# Patient Record
Sex: Male | Born: 1955 | Race: White | Hispanic: No | Marital: Married | Smoking: Never smoker
Health system: Southern US, Community
[De-identification: ages and names within clinical notes are randomized; demographics above are authoritative.]

## PROBLEM LIST (undated history)

## (undated) DIAGNOSIS — I10 Essential (primary) hypertension: Secondary | ICD-10-CM

## (undated) DIAGNOSIS — Z8619 Personal history of other infectious and parasitic diseases: Secondary | ICD-10-CM

## (undated) DIAGNOSIS — F419 Anxiety disorder, unspecified: Secondary | ICD-10-CM

## (undated) HISTORY — DX: Essential (primary) hypertension: I10

## (undated) HISTORY — DX: Personal history of other infectious and parasitic diseases: Z86.19

## (undated) HISTORY — DX: Anxiety disorder, unspecified: F41.9

## (undated) HISTORY — PX: FINGER SURGERY: SHX640

---

## 2005-05-21 ENCOUNTER — Ambulatory Visit: Payer: Self-pay | Admitting: Family Medicine

## 2005-07-05 ENCOUNTER — Ambulatory Visit: Payer: Self-pay | Admitting: Family Medicine

## 2005-07-24 ENCOUNTER — Ambulatory Visit: Payer: Self-pay | Admitting: Family Medicine

## 2005-09-11 ENCOUNTER — Ambulatory Visit: Payer: Self-pay | Admitting: Family Medicine

## 2005-12-05 ENCOUNTER — Ambulatory Visit: Payer: Self-pay | Admitting: Family Medicine

## 2006-04-22 ENCOUNTER — Ambulatory Visit: Payer: Self-pay | Admitting: Family Medicine

## 2006-09-03 ENCOUNTER — Ambulatory Visit: Payer: Self-pay | Admitting: Family Medicine

## 2006-09-06 ENCOUNTER — Ambulatory Visit (HOSPITAL_COMMUNITY): Admission: RE | Admit: 2006-09-06 | Discharge: 2006-09-06 | Payer: Self-pay | Admitting: Family Medicine

## 2007-06-26 ENCOUNTER — Ambulatory Visit: Payer: Self-pay | Admitting: Internal Medicine

## 2007-07-08 ENCOUNTER — Ambulatory Visit: Payer: Self-pay | Admitting: Internal Medicine

## 2008-03-01 ENCOUNTER — Ambulatory Visit: Payer: Self-pay | Admitting: Internal Medicine

## 2008-03-01 DIAGNOSIS — K222 Esophageal obstruction: Secondary | ICD-10-CM

## 2008-03-01 DIAGNOSIS — K449 Diaphragmatic hernia without obstruction or gangrene: Secondary | ICD-10-CM | POA: Insufficient documentation

## 2008-03-01 DIAGNOSIS — K219 Gastro-esophageal reflux disease without esophagitis: Secondary | ICD-10-CM | POA: Insufficient documentation

## 2008-07-13 ENCOUNTER — Encounter: Payer: Self-pay | Admitting: Pulmonary Disease

## 2008-07-28 ENCOUNTER — Encounter: Payer: Self-pay | Admitting: Pulmonary Disease

## 2008-07-28 ENCOUNTER — Ambulatory Visit (HOSPITAL_COMMUNITY): Admission: RE | Admit: 2008-07-28 | Discharge: 2008-07-28 | Payer: Self-pay | Admitting: Family Medicine

## 2008-08-03 ENCOUNTER — Emergency Department (HOSPITAL_COMMUNITY): Admission: EM | Admit: 2008-08-03 | Discharge: 2008-08-03 | Payer: Self-pay | Admitting: Emergency Medicine

## 2008-08-11 ENCOUNTER — Ambulatory Visit: Payer: Self-pay | Admitting: Pulmonary Disease

## 2008-08-11 DIAGNOSIS — Z77090 Contact with and (suspected) exposure to asbestos: Secondary | ICD-10-CM | POA: Insufficient documentation

## 2008-08-11 DIAGNOSIS — J209 Acute bronchitis, unspecified: Secondary | ICD-10-CM | POA: Insufficient documentation

## 2008-12-15 ENCOUNTER — Ambulatory Visit: Payer: Self-pay | Admitting: Internal Medicine

## 2008-12-15 DIAGNOSIS — K31819 Angiodysplasia of stomach and duodenum without bleeding: Secondary | ICD-10-CM | POA: Insufficient documentation

## 2008-12-22 ENCOUNTER — Telehealth: Payer: Self-pay | Admitting: Internal Medicine

## 2009-01-24 ENCOUNTER — Encounter (HOSPITAL_COMMUNITY): Admission: RE | Admit: 2009-01-24 | Discharge: 2009-02-23 | Payer: Self-pay | Admitting: Internal Medicine

## 2009-07-25 ENCOUNTER — Ambulatory Visit: Payer: Self-pay | Admitting: Internal Medicine

## 2009-07-25 DIAGNOSIS — R143 Flatulence: Secondary | ICD-10-CM

## 2009-07-25 DIAGNOSIS — R141 Gas pain: Secondary | ICD-10-CM

## 2009-07-25 DIAGNOSIS — E669 Obesity, unspecified: Secondary | ICD-10-CM | POA: Insufficient documentation

## 2009-07-25 DIAGNOSIS — R142 Eructation: Secondary | ICD-10-CM

## 2009-12-03 IMAGING — CT CT CHEST W/ CM
2 of 3 series · 15 of 36 positions shown, 18 images · IV contrast (Omnipaque 300)
Comparison: None

CLINICAL DATA: Lung nodules with history of asbestos exposure.

CT CHEST WITH CONTRAST
TECHNIQUE: Multidetector CT imaging of the chest was performed
following the standard protocol during bolus administration of
intravenous contrast.
Contrast: 80 ml 2mnipaque-9AA

[Series 2: chestroutine 5.0 b40f · axial · 0.74mm/px · z∈[-340,-70]mm · 12 of 64 slices shown, 15 images]
[im 5/64  mediastinal]
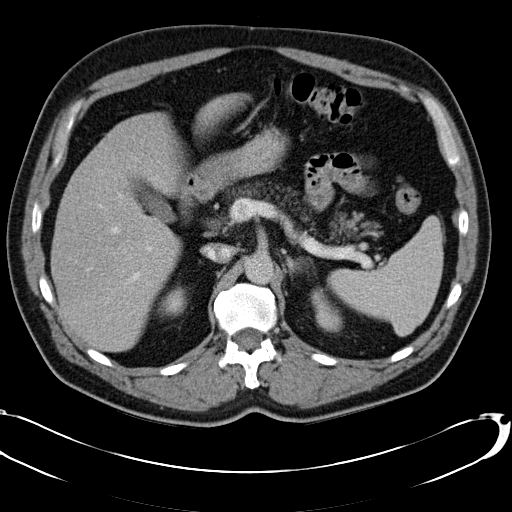
[im 5/64  lung]
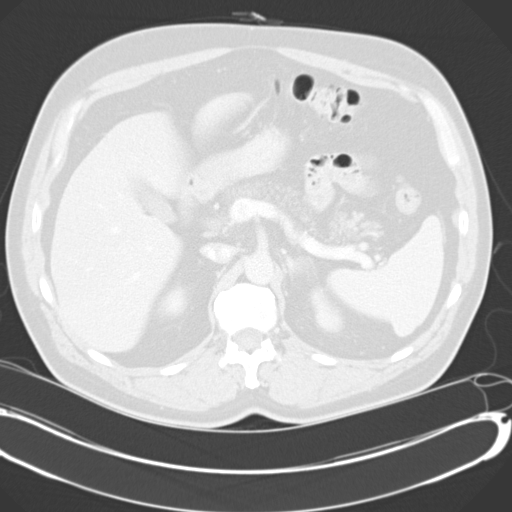
[im 10/64  lung]
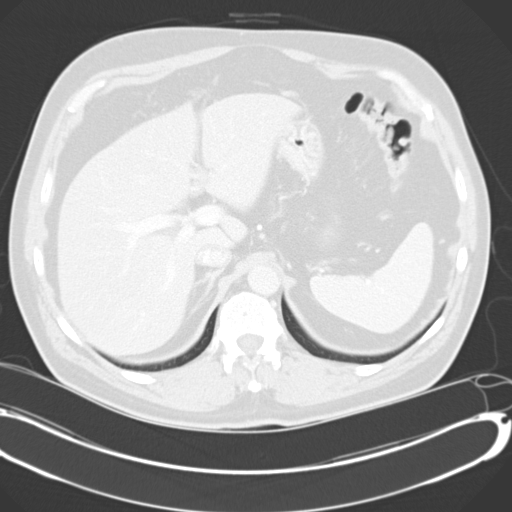
[im 15/64  lung]
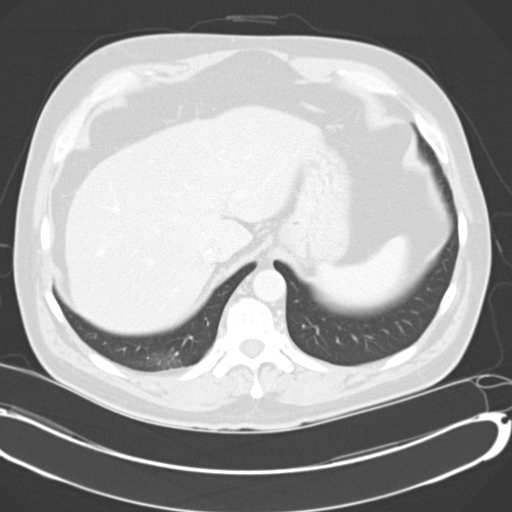
[im 19/64  lung]
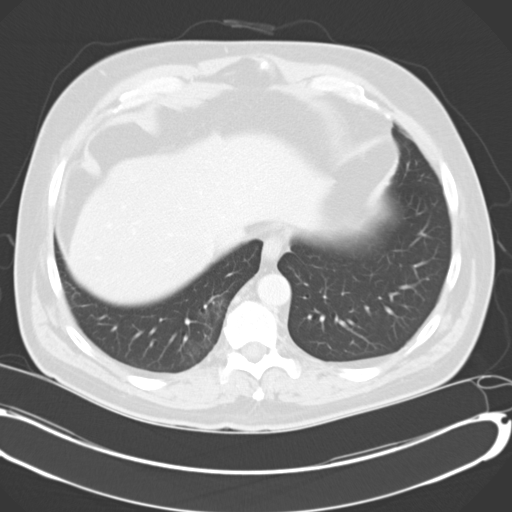
[im 24/64  mediastinal]
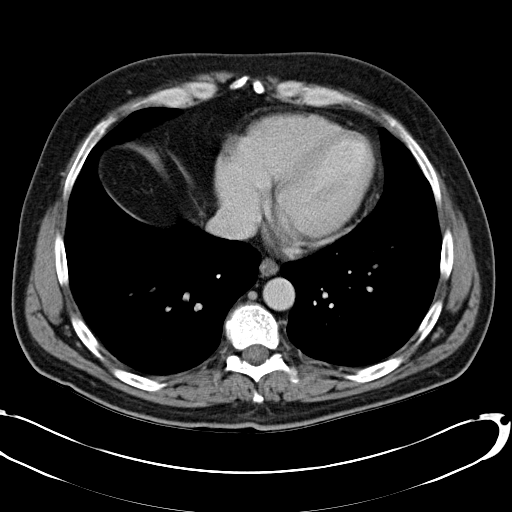
[im 24/64  lung]
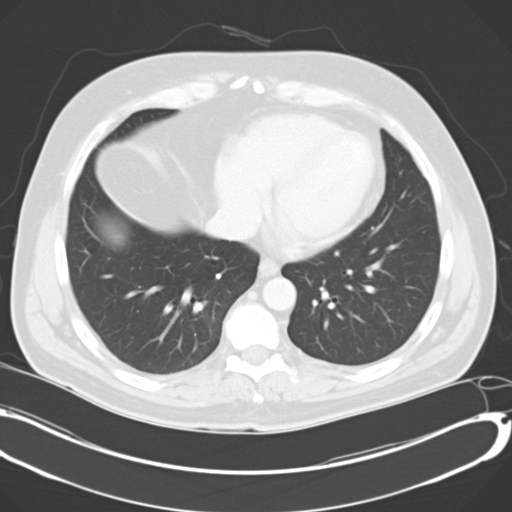
[im 29/64  lung]
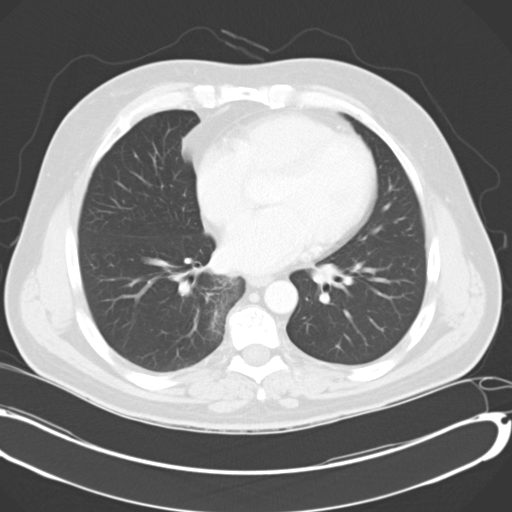
[im 36/64  lung]
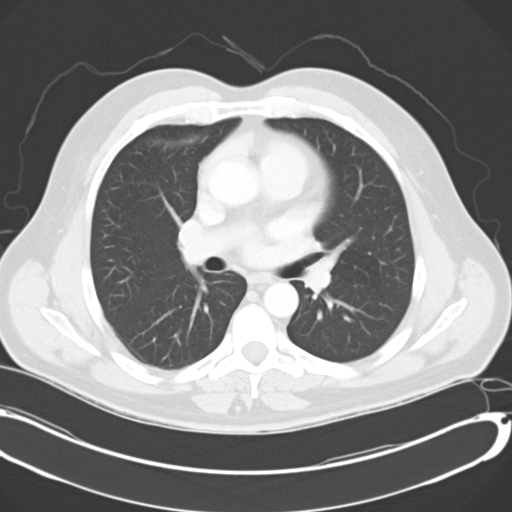
[im 40/64  lung]
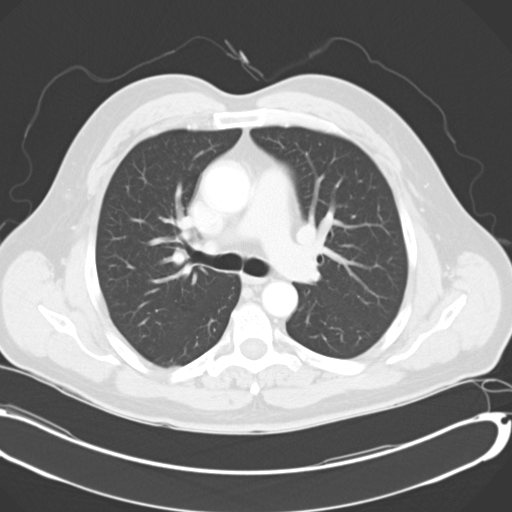
[im 45/64  mediastinal]
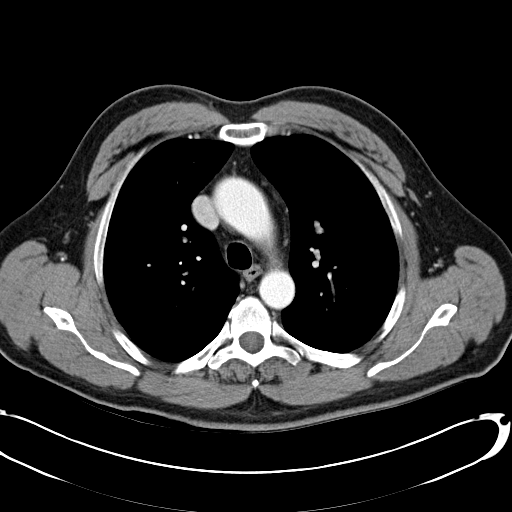
[im 45/64  lung]
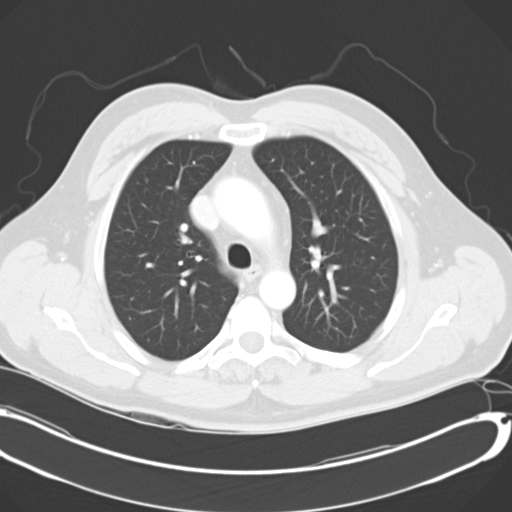
[im 50/64  lung]
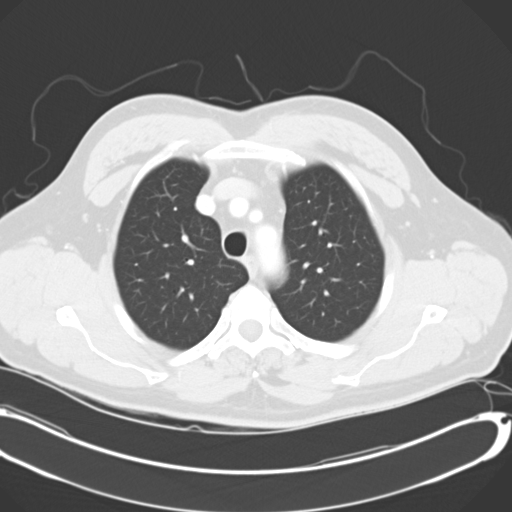
[im 54/64  lung]
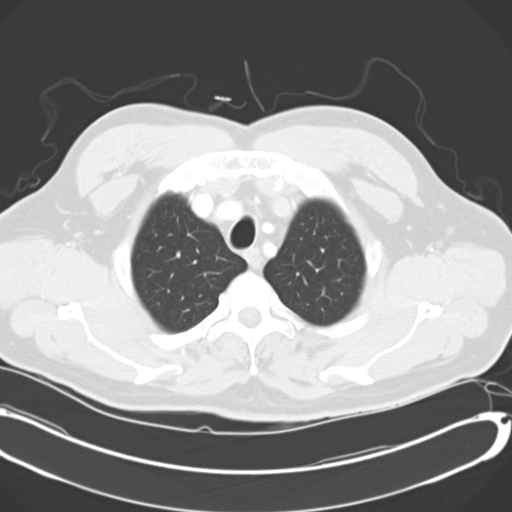
[im 59/64  lung]
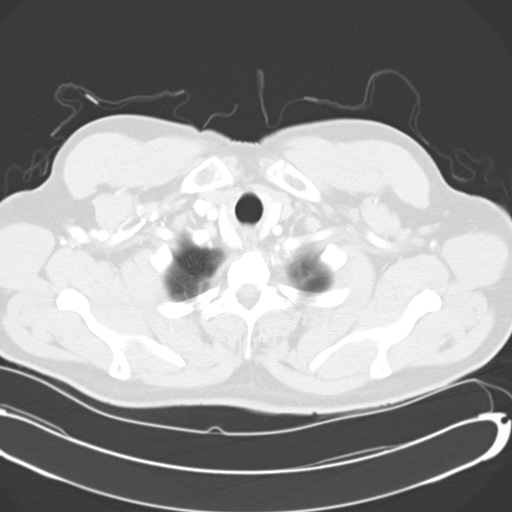

[Series 4: mpr coronal chest 3mm · coronal · 0.62mm/px · 3 of 91 slices shown]
[im 19/91  lung]
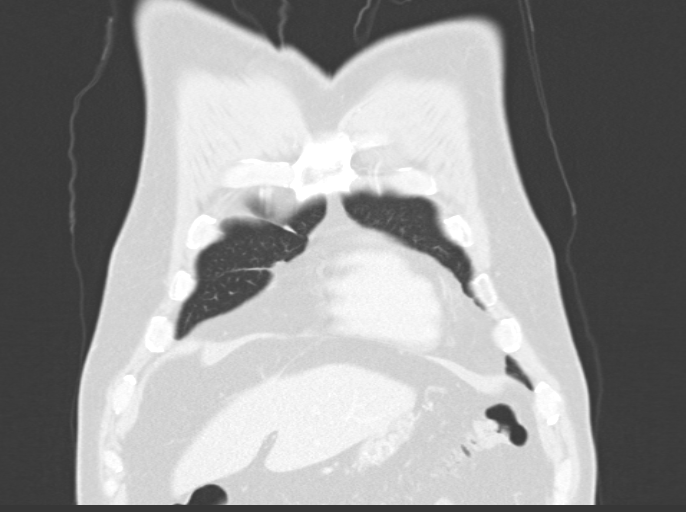
[im 37/91  lung]
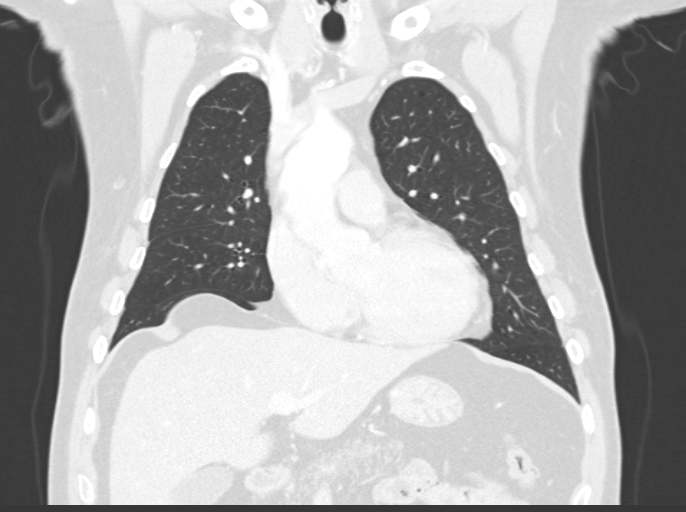
[im 55/91  lung]
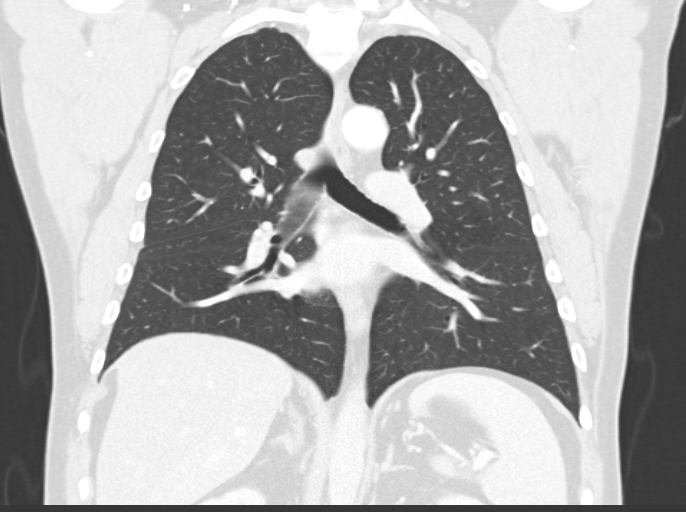

[15 of 36 positions shown; findings below may reference images not displayed]

FINDINGS: No pathologically enlarged mediastinal, hilar or axillary
lymph nodes.  Heart size within normal limits.  No pericardial
effusion.

Ill-defined ground-glass is seen in the medial aspect of the right
lower lobe.  Lungs are otherwise clear.  No pleural fluid.  Airway
is unremarkable.

Incidental imaging of the upper abdomen shows no acute findings.
Incidental note is made of a duodenal diverticulum.  No worrisome
lytic or sclerotic lesions.
IMPRESSION: 1.  Ill-defined ground-glass in the medial aspect of the right
lower lobe may be infectious or post infectious in etiology.
2.  No pulmonary nodules or evidence of asbestos related pleural
disease.

## 2009-12-09 IMAGING — CR DG CHEST 2V
2 series · 2 of 2 positions shown · non-contrast
Comparison: None

CLINICAL DATA: Weakness, congestion, elevated CK level

CHEST - 2 VIEW

[view not recorded (1 of 2)]
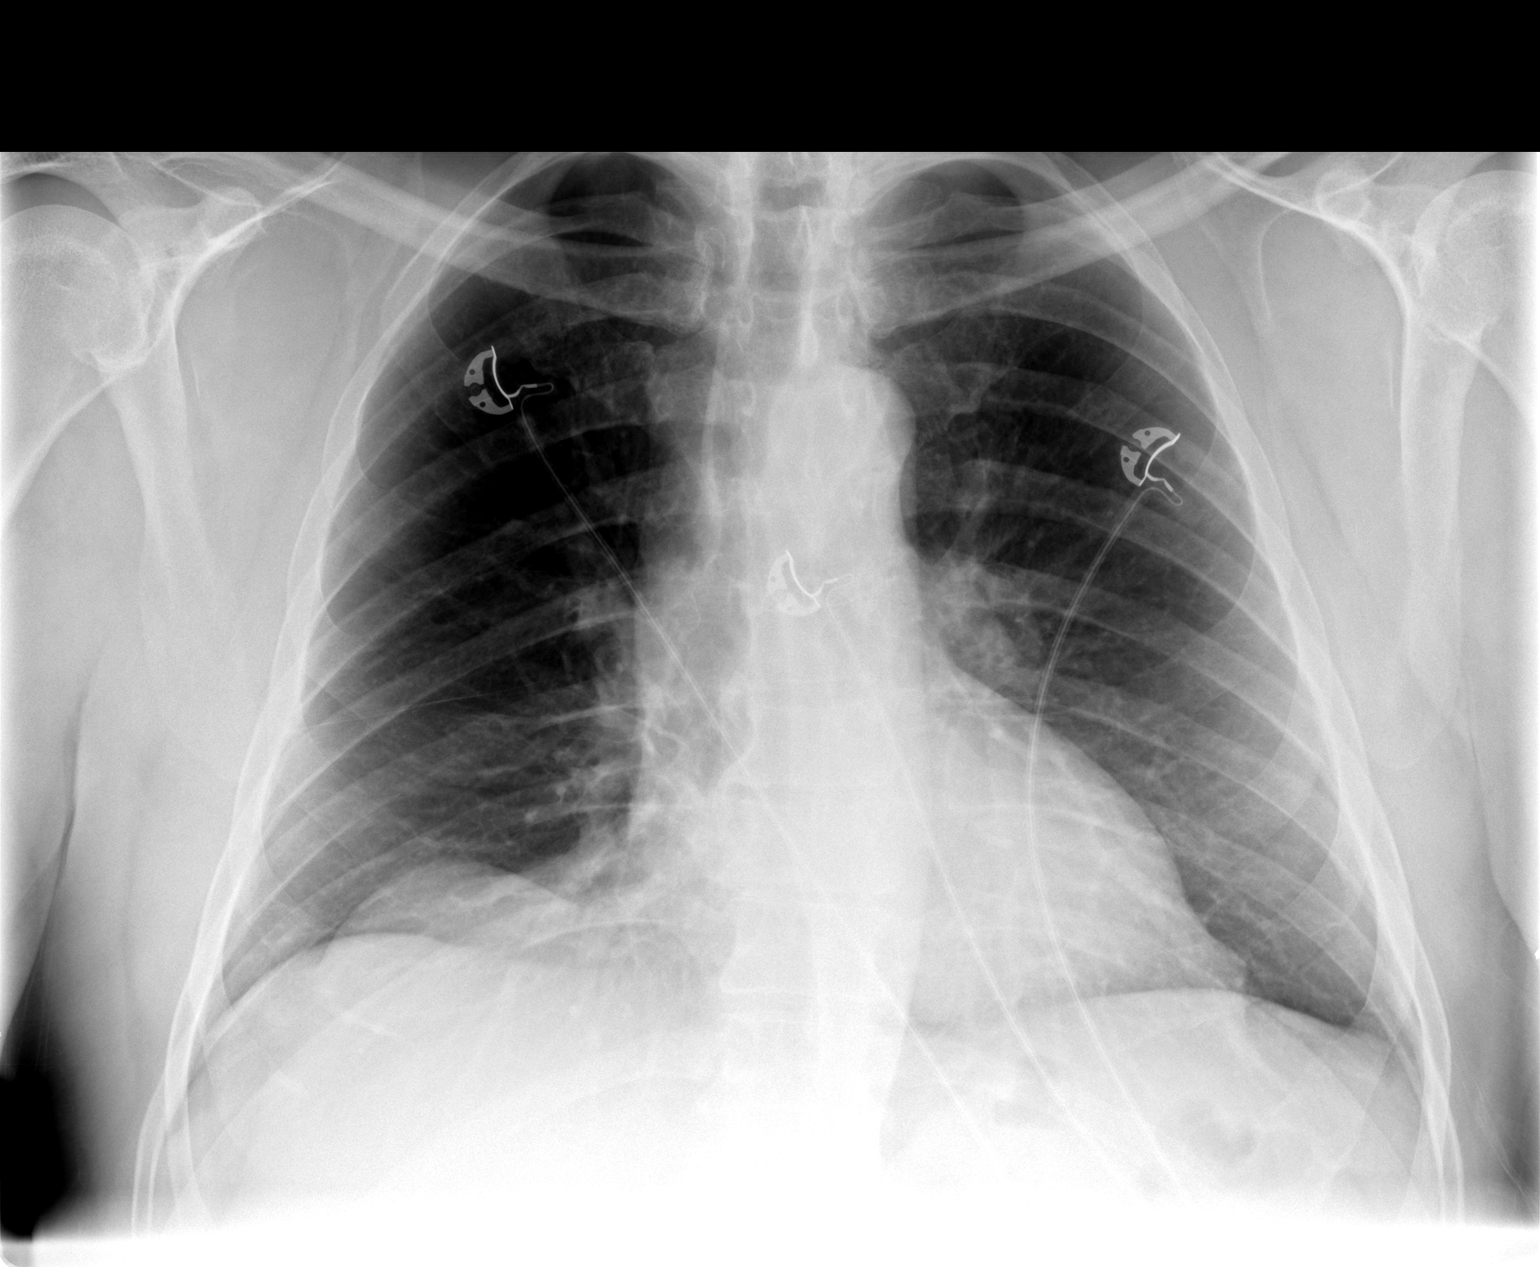

[view not recorded (2 of 2)]
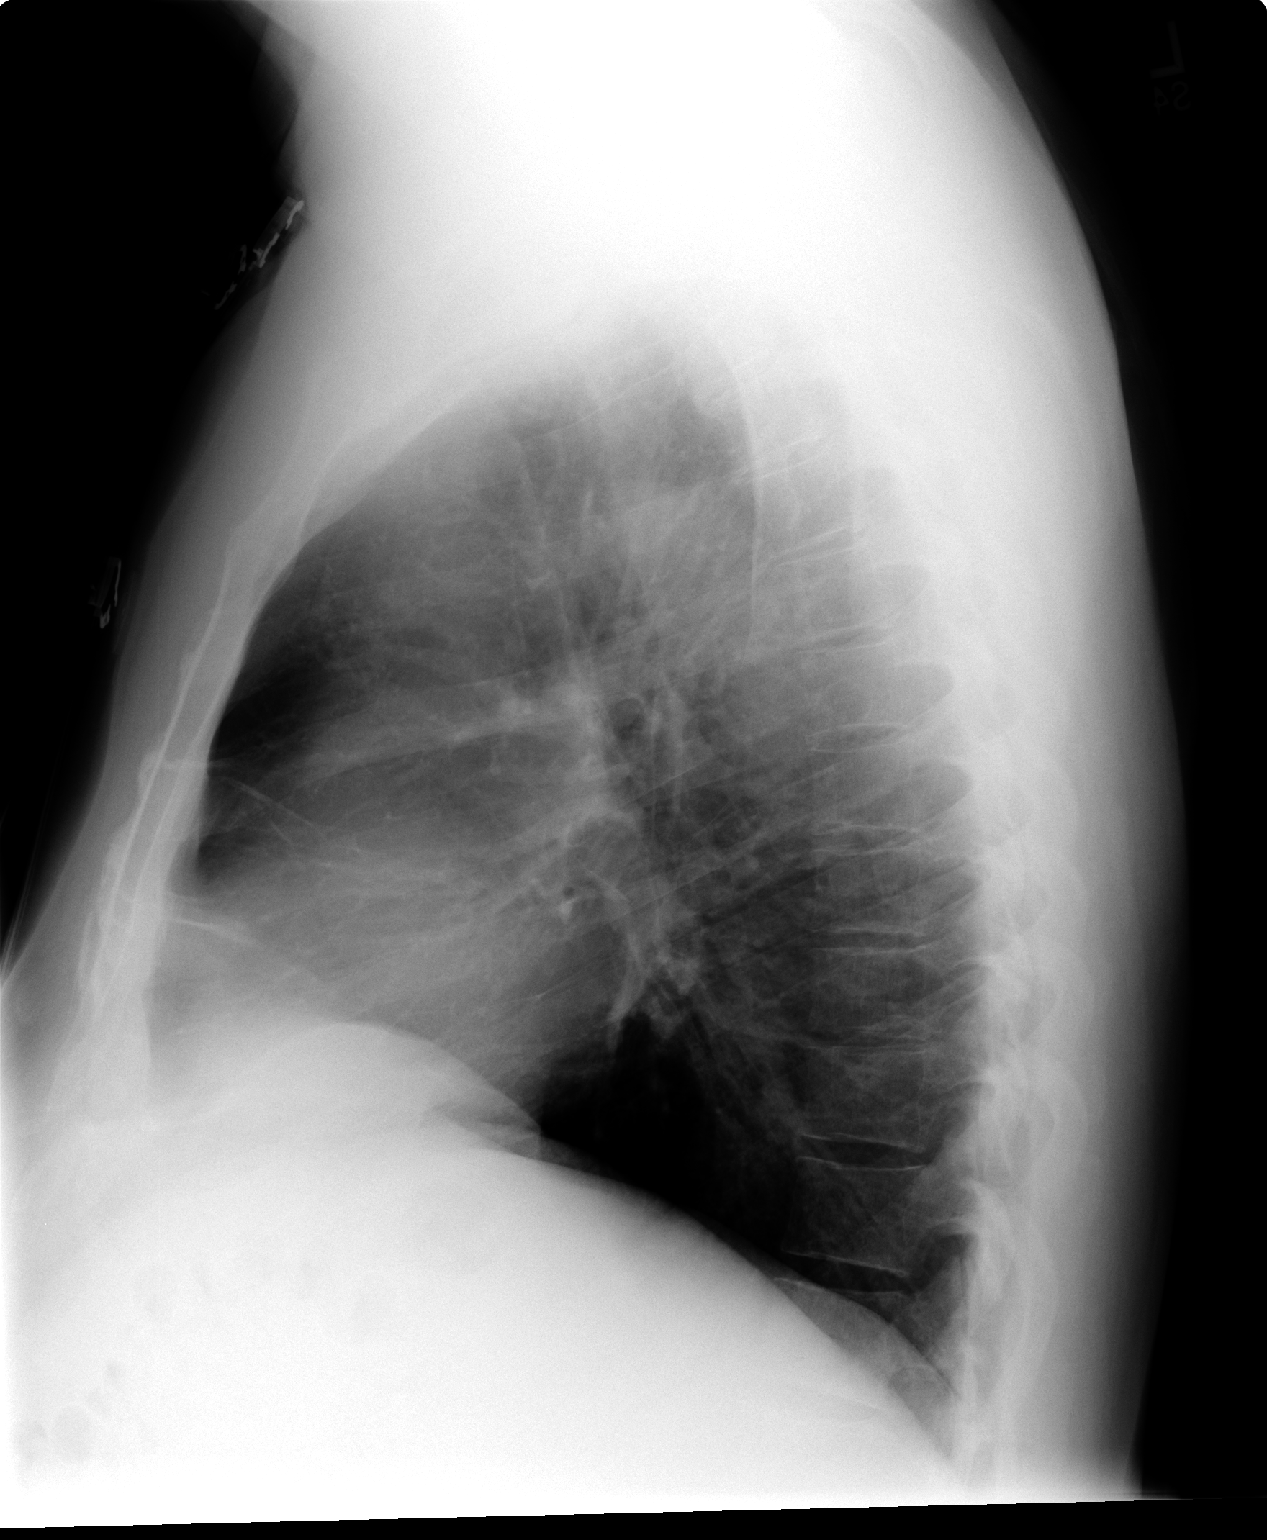

[2 of 2 positions shown; findings below may reference images not displayed]

FINDINGS: Borderline cardiac enlargement.
Normal mediastinal contours and pulmonary vascularity.
Anterior eventration right diaphragm.
Minimal subsegmental atelectasis right middle lobe.
Minimal peribronchial thickening.
No pulmonary infiltrate or pleural effusion.
Bones unremarkable.
IMPRESSION: Bronchitic changes with minimal subsegmental atelectasis at right
lung base.

## 2010-02-15 ENCOUNTER — Ambulatory Visit: Payer: Self-pay | Admitting: Internal Medicine

## 2010-02-15 LAB — CONVERTED CEMR LAB: IgA: 297 mg/dL (ref 68–378)

## 2010-02-17 ENCOUNTER — Telehealth: Payer: Self-pay | Admitting: Internal Medicine

## 2010-02-27 ENCOUNTER — Telehealth (INDEPENDENT_AMBULATORY_CARE_PROVIDER_SITE_OTHER): Payer: Self-pay | Admitting: *Deleted

## 2010-04-19 ENCOUNTER — Telehealth: Payer: Self-pay | Admitting: Internal Medicine

## 2010-05-16 NOTE — Progress Notes (Signed)
Summary: celiac labs negative  Phone Note Outgoing Call   Summary of Call: Let him know labs negative for celiac disease I want him to try Xifaxan 550 mg three times a day x 10 days for possible bacterial overgrowth looks like it is covered on his [employer] plan - if he cannot use that for rx then do metronidazole 500 mg two times a day x 10 days after he takes it he should call back after 2 weeks to let me know if it helped Iva Boop MD, [facility]  February 17, 2010 8:31 PM   Follow-up for Phone Call        LM to [facility] at home number, no message left on cell number-that is wife's cell, no message left at work number-as name on voicemail does not match pt's. Francee Piccolo CMA [facility] Dull)  February 21, 2010 10:45 AM   Notified pt of results.  He is agreeable with plan.  [facility]an was [facility] to [facility].  I will call pt in two weeks for update as pt states he will probably forget! Follow-up by: Francee Piccolo CMA [facility] Dull),  February 22, 2010 9:15 AM    New/Updated Medications: XIFAXAN 550 MG TABS (RIFAXIMIN) Take 1 tablet by mouth three times a day for 10 days Prescriptions: XIFAXAN 550 MG TABS (RIFAXIMIN) Take 1 tablet by mouth three times a day for 10 days  #30 x 0   Entered by:   Francee Piccolo CMA (AAMA)   Authorized by:   Iva Boop MD, [facility]   Signed by:   Francee Piccolo CMA (AAMA) on 02/22/2010   Method used:   [facility] to        [facility] [phone]* (retail)       [address]       [place], [place]  [address]       Ph: [phone] or [phone]       Fax: [phone]   RxID:   [id number]

## 2010-05-16 NOTE — Assessment & Plan Note (Signed)
Summary: stomach pain...em    History of Present Illness Visit Type: Follow-up Visit Primary GI MD: Stan Head MD [facility] Primary Provider: Joette Catching MD Requesting Provider: n/a Chief Complaint: abdominal pain constant ache History of Present Illness:   Last seen Sept 2010 and says his pain has persisted most of the time. He still has a band-like aggravating upper abdominal discomfort. Testosterone just started, HCTZ also startd since here last. Complains of as and bloating at times. Only gets heartbrn if he eats late at night. Sometimes sharper pain in upper abdomen relieved by belching. Sleep is not disturbed by this but only sleeps about 3 hours at night x years. Finds it hard to rest.  Knows he is obese, ordered P90X yesterday. He is concerned that he miht have cancer.   GI Review of Systems    Reports abdominal pain, belching, and  bloating.     Location of  Abdominal pain: upper abdomen.    Denies acid reflux, chest pain, dysphagia with liquids, dysphagia with solids, heartburn, loss of appetite, nausea, vomiting, vomiting blood, weight loss, and  weight gain.        Denies anal fissure, black tarry stools, change in bowel habit, constipation, diarrhea, diverticulosis, fecal incontinence, heme positive stool, hemorrhoids, irritable bowel syndrome, jaundice, light color stool, liver problems, rectal bleeding, and  rectal pain.    EGD  Procedure date:  07/08/2007  Findings:      1) LOWER ESOPHAGEAL RING WITH SOME ACTIVE ESOPHAGITIS (POSSIBLE VERY SHORT BARRETT'S ESOPHAGUS). DILATED TO 54 FR 2) 2 CM SLIDING HIATAL HERNIA 3) OTHERWISE NORMAL  Path Inflammation no metaplasia  Colonoscopy  Procedure date:  07/08/2007  Findings:      Results: Normal.   Comments:      Repeat colonoscopy in 10 years.    Procedures Next Due Date:    Colonoscopy: 07/2017   Current Medications (verified): 1)  Hydrochlorothiazide 25 Mg Tabs (Hydrochlorothiazide) .... Once Daily 2)   Zegerid 40-1100 Mg  Caps (Omeprazole-Sodium Bicarbonate) .... Take 1 Capsule By Mouth Once A Day 3)  Mediplex Plus  Tabs (Multiple Vitamins-Minerals) .... Once Daily 4)  Fish Oil  Oil (Fish Oil) .... Once Daily 5)  Testosterone Shot .... Weekly  Allergies (verified): No Known Drug Allergies  Past History:  Past Medical History: Reviewed history from 03/01/2008 and no changes required. GERD, esophageal stricture Hiatal Hernia Hypertension  Past Surgical History: Reviewed history from 03/01/2008 and no changes required. Unremarkable  Family History: Reviewed history from 03/01/2008 and no changes required. 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  Alcohol Use - no Illicit Drug Use - no Patient gets regular exercise.  Review of Systems       The patient complains of allergy/sinus and sleeping problems.    Vital Signs:  Patient profile:   55 year old male Height:      68 inches Weight:      209.38 pounds BMI:     31.95 Pulse rate:   100 / minute Pulse rhythm:   regular BP sitting:   130 / 94  (left arm) Cuff size:   regular  Vitals Entered By: June McMurray CMA Duncan Dull) (July 25, 2009 4:07 PM)  Physical Exam  General:  Well developed, well nourished, no acute distress. Overweight to obese Eyes:  anicteric Lungs:  Clear throughout to auscultation. Heart:  Regular rate and rhythm; no murmurs, rubs,  or bruits. Abdomen:  soft and nontender, no masses   Impression & Recommendations:  Problem # 1:  ABDOMINAL PAIN, UPPER (ICD-789.09) Assessment Unchanged since 2007 with bloating, etc [place], HIDA, EGD and CT Chest all neagtive 2008-2010  Problem # 2:  ABDOMINAL BLOATING (ICD-787.3) Assessment: Unchanged similar to before ? if some small bowel bacterial obergrowth issues will try Align  probiotic if that is not helpful he should consider a trial of buspirone which can jelp bloating/early satiety symptoms he also has underlying anxiety  Problem # 3:  OBESITY (ICD-278.00) Assessment: New He is aware of need to lose and is to try with exercise  Patient Instructions: 1)  Take 1 Align capsule daily for 1 month. Call Dr.Gessner after you complete that and let him know how you feel. 2)  You need to lose weight. Start by limiting portions, amounts. Avoid eating when not hungry. Limit desserts.Look for high fructose corn syrup on food labels and if in first 3 ingredients, avoid that food. Also try to eat whole grains, avoid "white foods" (e.g. white rice, white bread).  Also avoid high-fructose corn syrup, it is in many foods. 3)  Copy sent to : Joette Catching, MD 4)  The medication list was reviewed and reconciled.  All changed / newly prescribed medications were explained.  A complete medication list was provided to the patient / caregiver.

## 2010-05-16 NOTE — Progress Notes (Signed)
  Phone Note Other Incoming   Request: Send information Summary of Call: Request received from [facility] sent to [facility].

## 2010-05-16 NOTE — Assessment & Plan Note (Signed)
Summary: STOMACH DISCOMFORT...AS.    History of Present Illness Visit Type: Follow-up Visit Primary GI MD: Stan Head MD [facility] Primary Provider: Fredia Beets, MD  Requesting Provider: n/a Chief Complaint: abd discomfort  History of Present Illness:   Last seen in April 2011. He took align for 1 month with some help. Still has bilateral upper abdominal pain most likely associated with food. His brother-in-law was diagnosed with celiac disease and he wonders if that is the case. When he had called to make the appointment he was hurting fairly bad, he had been off Zegerid for 1-2 months, he went back on with help but not complete relief. He stopped testosterone for a while and symptoms did not change. Some stress associated with symptoms.    GI Review of Systems      Denies abdominal pain, acid reflux, belching, bloating, chest pain, dysphagia with liquids, dysphagia with solids, heartburn, loss of appetite, nausea, vomiting, vomiting blood, weight loss, and  weight gain.        Denies anal fissure, black tarry stools, change in bowel habit, constipation, diarrhea, diverticulosis, fecal incontinence, heme positive stool, hemorrhoids, irritable bowel syndrome, jaundice, light color stool, liver problems, rectal bleeding, and  rectal pain.    Current Medications (verified): 1)  Zegerid 40-1100 Mg  Caps (Omeprazole-Sodium Bicarbonate) .... Take 1 Capsule By Mouth Once A Day 2)  Fish Oil  Oil (Fish Oil) .... Once Daily 3)  Lisinopril 10 Mg Tabs (Lisinopril) .... One Tablet By Mouth Once Daily  Allergies (verified): No Known Drug Allergies  Past History:  Past Medical History: GERD, esophageal stricture Hiatal Hernia Hypertension ESOPHAGITIS  Past Surgical History: Reviewed history from 03/01/2008 and no changes required. Unremarkable  Family History: Reviewed history from 03/01/2008 and no changes required. No FH of Colon Cancer: Lung Cancer Mother Family History of Kidney  Disease:sister Family History of Diabetes: mother Family History of Heart mother  Social History: Reviewed history from 07/25/2009 and no changes required. 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  Alcohol Use - no Illicit Drug Use - no Patient gets regular exercise.  Vital Signs:  Patient profile:   55 year old male Height:      68 inches Weight:      212 pounds BMI:     32.35 BSA:     2.10 Pulse rate:   94 / minute Pulse rhythm:   regular BP sitting:   136 / 80  (left arm) Cuff size:   regular  Vitals Entered By: Ok Anis CMA (February 15, 2010 4:18 PM)  Physical Exam  General:  Well developed, well nourished, no acute distress. Overweight to obese Abdomen:  soft and nontender, no masses   Impression & Recommendations:  Problem # 1:  ABDOMINAL BLOATING (ICD-787.3) Assessment Unchanged  check celiac if not helpful Abx (Align helped some) consider gluten free or low Carb  Orders: T-Tissue Transglutamase Ab IgA (87564-33295) TLB-IgA (Immunoglobulin A) (82784-IGA)  Problem # 2:  OBESITY (ICD-278.00) Assessment: Unchanged discussed exercise and weight loss again has fatigue after work and some arthralgias, etc await studies he should try to exrcise and could coordinate with Dr. Lendon Colonel.  Problem # 3:  ABDOMINAL PAIN, UPPER (ICD-789.09) Assessment: Unchanged slightly better with align await TTG Ab  Patient Instructions: 1)  Please go to the basement to have your lab tests drawn today. 2)  TTG antibody and IgA level to look for celiac disease 3)  Dr. Leone Payor will call with results and plans. 4)  try to come with an exercise plan, it will help your mind and body. Anxiey may be playing some role in your problems (not all). 5)  Copy sent to : Fredia Beets, MD 6)  The medication list was reviewed and reconciled.  All changed / newly prescribed medications were explained.  A complete medication list was provided to the patient / caregiver.

## 2010-05-18 NOTE — Progress Notes (Signed)
Summary: Pt changed Primary Care Physician   Phone Note Other Incoming   Caller: pt Summary of Call: Calling to change his Primary Care Physician in our records. Is switching to Dr Fredia Beets in [place], [place]. Noted in IDX. Initial call taken by: Leanor Kail [facility],  April 19, 2010 4:22 PM

## 2010-07-26 LAB — BASIC METABOLIC PANEL
BUN: 10 mg/dL (ref 6–23)
CO2: 25 mEq/L (ref 19–32)
Chloride: 107 mEq/L (ref 96–112)
Glucose, Bld: 119 mg/dL — ABNORMAL HIGH (ref 70–99)
Potassium: 4 mEq/L (ref 3.5–5.1)
Sodium: 138 mEq/L (ref 135–145)

## 2010-07-26 LAB — CBC
MCV: 84.1 fL (ref 78.0–100.0)
RBC: 4.99 MIL/uL (ref 4.22–5.81)

## 2010-07-26 LAB — DIFFERENTIAL
Basophils Absolute: 0 10*3/uL (ref 0.0–0.1)
Basophils Relative: 0 % (ref 0–1)
Eosinophils Absolute: 0.1 10*3/uL (ref 0.0–0.7)
Lymphocytes Relative: 13 % (ref 12–46)
Monocytes Relative: 4 % (ref 3–12)

## 2010-07-26 LAB — CREATININE, SERUM
Creatinine, Ser: 0.91 mg/dL (ref 0.4–1.5)
GFR calc non Af Amer: >60 mL/min (ref >60)

## 2010-07-26 LAB — POCT CARDIAC MARKERS
CKMB, poc: <1 ng/mL — ABNORMAL LOW (ref 1.0–8.0)
Myoglobin, poc: 96.6 ng/mL (ref 12–200)

## 2010-07-26 LAB — CK TOTAL AND CKMB (NOT AT ARMC)
Relative Index: INVALID (ref 0.0–2.5)
Total CK: 90 U/L (ref 7–232)

## 2010-08-29 NOTE — Assessment & Plan Note (Signed)
[facility]                         GASTROENTEROLOGY OFFICE NOTE   David Dalton, David Dalton                        MRN:          [id number]  DATE:06/26/2007                            DOB:          Jul 09, 1955    REQUESTING PHYSICIAN:  Delaney Meigs, M.D.   REASON FOR CONSULTATION:  1. Abdominal pain.  2. Dysphagia.   ASSESSMENT:  A 55 year old white man who has had a solid food dysphagia  off and on over 1 to 2 years, usually when he ate bread quickly.  However, a month ago he had a significant food impaction relieved by  regurgitation.  A previous upper gastrointestinal series shows a small  sliding hiatus hernia and distal esophageal ring, which I think is the  cause of his dysphagia.   RECOMMENDATIONS AND PLAN:  1. Schedule upper GI endoscopy with esophageal dilation.  We will try      to treat his dysphagia.  He also has some bilateral upper abdominal      pain and epigastric pain at times, and we will look for possible      causes of that.  He is on proton pump inhibitor already.  2. Schedule screening colonoscopy in this man who is 55 and has not      yet had one.   Risks, benefits and indications of the procedures are explained.  He  understands and agrees to proceed.   HISTORY:  A 55 year old white man with problems as outline above.  He  used to have some heartburn but not much.  A year ago he started having  some upper abdominal pain, left and right, described as an irritation  and usually somewhat better when he eats.  He had the upper GI series as  well as an ultrasound.  He had the hiatal hernia and esophageal ring.  He had an incidental duodenal diverticulum.  He had small gallbladder  wall polyps, otherwise unremarkable ultrasound.  These studies were on  Sep 06, 2006.  Since that time, things got somewhat worse.  A couple of  months ago he ended up on a proton pump inhibitor taking Prevacid.  This  was switched to pantoprazole  due to insurance reasons.  He does feel  somewhat better on that but not completely relieved with this  irritation in the left, right upper quadrants and epigastrium.  Dysphagia problems as outlined above.  His GI review of systems is  otherwise completely unremarkable.   MEDICATIONS:  1. Lisinopril/hydrochlorothiazide 12.5 mg daily.  2. Pantoprazole 40 mg daily.   No known drug allergies.   P.R.N. MEDICATIONS:  Lorazepam 0.5 mg.   PAST MEDICAL HISTORY:  1. Hypertension.  2. Dyslipidemia.  3. Anxiety with some associated chest pain.   FAMILY HISTORY:  Diverticular disease in the mother and sisters.  Father  had heart disease.  Mother had diabetes.  No colon cancer.   SOCIAL HISTORY:  He is married.  He is a Data processing manager at [employer].  Lives with his wife and daughter.  No alcohol, tobacco, or  drugs.  No caffeine use.  REVIEW OF SYSTEMS:  Positive for fatigue.  All other systems negative.   PHYSICAL EXAMINATION:  Well-developed, well-nourished overweight white  man.  Height 5 8.  Weight 204 pounds.  Blood pressure 124/86.  Pulse 64.  EYES:  Anicteric.  ENT:  Normal mouth, oropharynx.  NECK:  Supple without mass.  CHEST:  Clear.  HEART:  S1, S2.  I hear no murmurs or gallops.  ABDOMEN:  Soft, nontender.  No organomegaly or mass.  Bowel sounds are  present.  There are no bruits.  RECTAL:  Deferred.  LYMPHATIC:  No neck, supraclavicular, inguinal adenopathy.  LOWER EXTREMITIES:  Free of edema.  There is no cyanosis or clubbing of  the digits of the hands.  SKIN:  Without an acute rash.  PSYCHIATRIC:  Appropriate affect and mood.  NEUROLOGIC:  Cranial nerves II through XII intact.  Grossly nonfocal.   I appreciate the opportunity to care for this patient.     David Boop, MD,FACG  Electronically Signed    CEG/MedQ  DD: 06/26/2007  DT: 06/26/2007  Job #: [id number]   cc:   Delaney Meigs, M.D.

## 2013-11-16 DIAGNOSIS — I10 Essential (primary) hypertension: Secondary | ICD-10-CM | POA: Insufficient documentation

## 2017-06-27 ENCOUNTER — Encounter: Payer: Self-pay | Admitting: Internal Medicine

## 2017-07-02 DIAGNOSIS — F4322 Adjustment disorder with anxiety: Secondary | ICD-10-CM | POA: Insufficient documentation

## 2018-05-13 ENCOUNTER — Encounter: Payer: Self-pay | Admitting: Physician Assistant

## 2018-05-13 ENCOUNTER — Ambulatory Visit (INDEPENDENT_AMBULATORY_CARE_PROVIDER_SITE_OTHER): Payer: PRIVATE HEALTH INSURANCE | Admitting: Physician Assistant

## 2018-05-13 ENCOUNTER — Other Ambulatory Visit: Payer: Self-pay

## 2018-05-13 VITALS — BP 130/80 | HR 85 | Temp 98.0°F | Resp 14 | Ht 69.0 in | Wt 220.0 lb

## 2018-05-13 DIAGNOSIS — I10 Essential (primary) hypertension: Secondary | ICD-10-CM | POA: Diagnosis not present

## 2018-05-13 DIAGNOSIS — F411 Generalized anxiety disorder: Secondary | ICD-10-CM

## 2018-05-13 DIAGNOSIS — M6208 Separation of muscle (nontraumatic), other site: Secondary | ICD-10-CM | POA: Diagnosis not present

## 2018-05-13 LAB — COMPREHENSIVE METABOLIC PANEL
ALK PHOS: 91 U/L (ref 39–117)
ALT: 37 U/L (ref 0–53)
AST: 27 U/L (ref 0–37)
Albumin: 4.4 g/dL (ref 3.5–5.2)
BILIRUBIN TOTAL: 0.4 mg/dL (ref 0.2–1.2)
BUN: 18 mg/dL (ref 6–23)
CO2: 29 meq/L (ref 19–32)
CREATININE: 0.98 mg/dL (ref 0.40–1.50)
Calcium: 10.2 mg/dL (ref 8.4–10.5)
Chloride: 102 mEq/L (ref 96–112)
GFR: 76.99 mL/min (ref >60.00)
GLUCOSE: 126 mg/dL — AB (ref 70–99)
Potassium: 5 mEq/L (ref 3.5–5.1)
SODIUM: 139 meq/L (ref 135–145)
TOTAL PROTEIN: 7 g/dL (ref 6.0–8.3)

## 2018-05-13 LAB — VITAMIN B12: VITAMIN B 12: 1094 pg/mL — AB (ref 211–911)

## 2018-05-13 LAB — LIPID PANEL
Cholesterol: 273 mg/dL — ABNORMAL HIGH (ref 0–200)
HDL: 31.8 mg/dL — ABNORMAL LOW (ref >39.00)
Total CHOL/HDL Ratio: 9
Triglycerides: 414 mg/dL — ABNORMAL HIGH (ref 0.0–149.0)

## 2018-05-13 LAB — VITAMIN D 25 HYDROXY (VIT D DEFICIENCY, FRACTURES): VITD: 55.53 ng/mL (ref 30.00–100.00)

## 2018-05-13 LAB — LDL CHOLESTEROL, DIRECT: LDL DIRECT: 158 mg/dL

## 2018-05-13 NOTE — Assessment & Plan Note (Signed)
Continue medicine PRN for acute anxiety giving rare symptoms.

## 2018-05-13 NOTE — Assessment & Plan Note (Signed)
BP stable. Continue DASH diet. Continue current medication regimen. Repeat labs today. May consider wean of medication in the near future.

## 2018-05-13 NOTE — Assessment & Plan Note (Signed)
Reassurance given to patient. Will monitor. Exercises recommended.

## 2018-05-13 NOTE — Progress Notes (Signed)
Patient presents to clinic today to establish care.  Acute Concerns: Would like to check Vitamin D with labs.   Chronic Issues: Hypertension -- Currently on a regimen of lisinopril 10 mg daily. Is taking as directed. Patient denies chest pain, palpitations, lightheadedness, dizziness, vision changes or frequent headaches.  BP Readings from Last 3 Encounters:  05/13/18 130/80   Diastasis Recti -- Diagnosed previously. Denies any current issue with this. Notes pressure in the area with overeating.   Anxiety: very rarely takes Lorazepam ( About 1 pill every 2 months)    Diet: eats a lot of vegetables, does not eat fried foods or meat, does not drink sodas,  Exercise: Exercises about 2xweek and hopes to return to walking several miles a week.   Past Medical History:  Diagnosis Date  . Anxiety   . History of chickenpox   . Hypertension     Past Surgical History:  Procedure Laterality Date  . FINGER SURGERY      Current Outpatient Medications on File Prior to Visit  Medication Sig Dispense Refill  . lisinopril (PRINIVIL,ZESTRIL) 10 MG tablet Take 10 mg by mouth daily.    Marland Kitchen LORazepam (ATIVAN) 0.5 MG tablet Take 1 tablet by mouth every 6 (six) hours as needed.    . vitamin B-12 (CYANOCOBALAMIN) 500 MCG tablet Take 500 mcg by mouth daily.     No current facility-administered medications on file prior to visit.     No Known Allergies  Family History  Problem Relation Age of Onset  . Cancer Mother   . Anxiety disorder Mother   . Diabetes Mother   . Alcohol abuse Father   . Heart disease Father   . Heart attack Father 34  . Cancer Sister   . Diabetes Brother   . Cancer Sister   . Cancer Sister     Social History   Socioeconomic History  . Marital status: Married    Spouse name: Not on file  . Number of children: Not on file  . Years of education: Not on file  . Highest education level: Not on file  Occupational History  . Not on file  Social Needs  .  Financial resource strain: Not on file  . Food insecurity:    Worry: Not on file    Inability: Not on file  . Transportation needs:    Medical: Not on file    Non-medical: Not on file  Tobacco Use  . Smoking status: Never Smoker  . Smokeless tobacco: Never Used  Substance and Sexual Activity  . Alcohol use: Not Currently  . Drug use: Not Currently  . Sexual activity: Yes  Lifestyle  . Physical activity:    Days per week: Not on file    Minutes per session: Not on file  . Stress: Not on file  Relationships  . Social connections:    Talks on phone: Not on file    Gets together: Not on file    Attends religious service: Not on file    Active member of club or organization: Not on file    Attends meetings of clubs or organizations: Not on file    Relationship status: Not on file  . Intimate partner violence:    Fear of current or ex partner: Not on file    Emotionally abused: Not on file    Physically abused: Not on file    Forced sexual activity: Not on file  Other Topics Concern  . Not on file  Social History Narrative  . Not on file   Review of Systems  Constitutional: Negative for fever and weight loss.  HENT: Negative for ear discharge, ear pain, hearing loss and tinnitus.   Eyes: Negative for blurred vision, double vision, photophobia and pain.  Respiratory: Negative for cough and shortness of breath.   Cardiovascular: Negative for chest pain and palpitations.  Gastrointestinal: Negative for abdominal pain, blood in stool, constipation, diarrhea, heartburn, melena, nausea and vomiting.  Genitourinary: Negative for dysuria, flank pain, frequency, hematuria and urgency.  Musculoskeletal: Negative for falls.  Neurological: Negative for dizziness, loss of consciousness and headaches.  Endo/Heme/Allergies: Negative for environmental allergies.  Psychiatric/Behavioral: Negative for depression, hallucinations, substance abuse and suicidal ideas. The patient is not  nervous/anxious and does not have insomnia.    BP 130/80   Pulse 85   Temp 98 F (36.7 C) (Oral)   Resp 14   Ht 5\' 9"  (1.753 m)   Wt 220 lb (99.8 kg)   SpO2 97%   BMI 32.49 kg/m   Physical Exam Vitals signs reviewed.  Constitutional:      General: He is not in acute distress.    Appearance: He is well-developed. He is not diaphoretic.  HENT:     Head: Normocephalic and atraumatic.     Right Ear: Tympanic membrane, ear canal and external ear normal.     Left Ear: Tympanic membrane, ear canal and external ear normal.     Nose: Nose normal.     Mouth/Throat:     Pharynx: No posterior oropharyngeal erythema.  Eyes:     Extraocular Movements: Extraocular movements intact.     Conjunctiva/sclera: Conjunctivae normal.     Pupils: Pupils are equal, round, and reactive to light.  Neck:     Musculoskeletal: Neck supple.     Thyroid: No thyromegaly.  Cardiovascular:     Rate and Rhythm: Normal rate and regular rhythm.     Heart sounds: Normal heart sounds.  Pulmonary:     Effort: Pulmonary effort is normal. No respiratory distress.     Breath sounds: Normal breath sounds. No wheezing or rales.  Chest:     Chest wall: No tenderness.  Abdominal:     General: Bowel sounds are normal.     Palpations: Abdomen is soft. There is no mass.     Tenderness: There is no abdominal tenderness. There is no guarding or rebound.     Hernia: No hernia is present.    Lymphadenopathy:     Cervical: No cervical adenopathy.  Skin:    General: Skin is warm and dry.     Findings: No rash.  Neurological:     Mental Status: He is alert and oriented to person, place, and time.     Cranial Nerves: No cranial nerve deficit.     Motor: Motor function is intact.    Assessment/Plan: Benign essential hypertension BP stable. Continue DASH diet. Continue current medication regimen. Repeat labs today. May consider wean of medication in the near future.   Diastasis recti Reassurance given to patient.  Will monitor. Exercises recommended.   Anxiety state Continue medicine PRN for acute anxiety giving rare symptoms.    Leeanne Rio, PA-C

## 2018-05-13 NOTE — Patient Instructions (Signed)
Please go to the lab today for blood work.  I will call you with your results. We will alter treatment regimen(s) if indicated by your results.   Please continue current medication regimen for now. Once I have reviewed lab results we may attempt a trial of lower dose of lisinopril. Keep a low-salt diet.  It was very nice meeting you today. Welcome to [facility]!   DASH Eating Plan DASH stands for "Dietary Approaches to Stop Hypertension." The DASH eating plan is a healthy eating plan that has been shown to reduce high blood pressure (hypertension). It may also reduce your risk for type 2 diabetes, heart disease, and stroke. The DASH eating plan may also help with weight loss. What are tips for following this plan?  General guidelines  Avoid eating more than 2,300 mg (milligrams) of salt (sodium) a day. If you have hypertension, you may need to reduce your sodium intake to 1,500 mg a day.  Limit alcohol intake to no more than 1 drink a day for nonpregnant women and 2 drinks a day for men. One drink equals 12 oz of beer, 5 oz of wine, or 1 oz of hard liquor.  Work with your health care provider to maintain a healthy body weight or to lose weight. Ask what an ideal weight is for you.  Get at least 30 minutes of exercise that causes your heart to beat faster (aerobic exercise) most days of the week. Activities may include walking, swimming, or biking.  Work with your health care provider or diet and nutrition specialist (dietitian) to adjust your eating plan to your individual calorie needs. Reading food labels   Check food labels for the amount of sodium per serving. Choose foods with less than 5 percent of the Daily Value of sodium. Generally, foods with less than 300 mg of sodium per serving fit into this eating plan.  To find whole grains, look for the word "whole" as the first word in the ingredient list. Shopping  Buy products labeled as "low-sodium" or "no salt added."  Buy  fresh foods. Avoid canned foods and premade or frozen meals. Cooking  Avoid adding salt when cooking. Use salt-free seasonings or herbs instead of table salt or sea salt. Check with your health care provider or pharmacist before using salt substitutes.  Do not fry foods. Cook foods using healthy methods such as baking, boiling, grilling, and broiling instead.  Cook with heart-healthy oils, such as olive, canola, soybean, or sunflower oil. Meal planning  Eat a balanced diet that includes: ? 5 or more servings of fruits and vegetables each day. At each meal, try to fill half of your plate with fruits and vegetables. ? Up to 6-8 servings of whole grains each day. ? Less than 6 oz of lean meat, poultry, or fish each day. A 3-oz serving of meat is about the same size as a deck of cards. One egg equals 1 oz. ? 2 servings of low-fat dairy each day. ? A serving of nuts, seeds, or beans 5 times each week. ? Heart-healthy fats. Healthy fats called Omega-3 fatty acids are found in foods such as flaxseeds and coldwater fish, like sardines, salmon, and mackerel.  Limit how much you eat of the following: ? Canned or prepackaged foods. ? Food that is high in trans fat, such as fried foods. ? Food that is high in saturated fat, such as fatty meat. ? Sweets, desserts, sugary drinks, and other foods with added sugar. ? Full-fat dairy  products.  Do not salt foods before eating.  Try to eat at least 2 vegetarian meals each week.  Eat more home-cooked food and less restaurant, buffet, and fast food.  When eating at a restaurant, ask that your food be prepared with less salt or no salt, if possible. What foods are recommended? The items listed may not be a complete list. Talk with your dietitian about what dietary choices are best for you. Grains Whole-grain or whole-wheat bread. Whole-grain or whole-wheat pasta. Brown rice. [place]. Bulgur. Whole-grain and low-sodium cereals. Pita bread.  Low-fat, low-sodium crackers. Whole-wheat flour tortillas. Vegetables Fresh or frozen vegetables (raw, steamed, roasted, or grilled). Low-sodium or reduced-sodium tomato and vegetable juice. Low-sodium or reduced-sodium tomato sauce and tomato paste. Low-sodium or reduced-sodium canned vegetables. Fruits All fresh, dried, or frozen fruit. Canned fruit in natural juice (without added sugar). Meat and other protein foods Skinless chicken or [place]. Ground chicken or [place]. Pork with fat trimmed off. Fish and seafood. Egg whites. Dried beans, peas, or lentils. Unsalted nuts, nut butters, and seeds. Unsalted canned beans. Lean cuts of beef with fat trimmed off. Low-sodium, lean deli meat. Dairy Low-fat (1%) or fat-free (skim) milk. Fat-free, low-fat, or reduced-fat cheeses. Nonfat, low-sodium ricotta or cottage cheese. Low-fat or nonfat yogurt. Low-fat, low-sodium cheese. Fats and oils Soft margarine without trans fats. Vegetable oil. Low-fat, reduced-fat, or light mayonnaise and salad dressings (reduced-sodium). Canola, safflower, olive, soybean, and sunflower oils. Avocado. Seasoning and other foods Herbs. Spices. Seasoning mixes without salt. Unsalted popcorn and pretzels. Fat-free sweets. What foods are not recommended? The items listed may not be a complete list. Talk with your dietitian about what dietary choices are best for you. Grains Baked goods made with fat, such as croissants, muffins, or some breads. Dry pasta or rice meal packs. Vegetables Creamed or fried vegetables. Vegetables in a cheese sauce. Regular canned vegetables (not low-sodium or reduced-sodium). Regular canned tomato sauce and paste (not low-sodium or reduced-sodium). Regular tomato and vegetable juice (not low-sodium or reduced-sodium). Angie Fava. Olives. Fruits Canned fruit in a light or heavy syrup. Fried fruit. Fruit in cream or butter sauce. Meat and other protein foods Fatty cuts of meat. Ribs. Fried meat. Berniece Salines.  Sausage. Bologna and other processed lunch meats. Salami. Fatback. Hotdogs. Bratwurst. Salted nuts and seeds. Canned beans with added salt. Canned or smoked fish. Whole eggs or egg yolks. Chicken or [place] with skin. Dairy Whole or 2% milk, cream, and half-and-half. Whole or full-fat cream cheese. Whole-fat or sweetened yogurt. Full-fat cheese. Nondairy creamers. Whipped toppings. Processed cheese and cheese spreads. Fats and oils Butter. Stick margarine. Lard. Shortening. Ghee. Bacon fat. Tropical oils, such as coconut, palm kernel, or palm oil. Seasoning and other foods Salted popcorn and pretzels. Onion salt, garlic salt, seasoned salt, table salt, and sea salt. Worcestershire sauce. Tartar sauce. Barbecue sauce. Teriyaki sauce. Soy sauce, including reduced-sodium. Steak sauce. Canned and packaged gravies. Fish sauce. Oyster sauce. Cocktail sauce. Horseradish that you find on the shelf. Ketchup. Mustard. Meat flavorings and tenderizers. Bouillon cubes. Hot sauce and Tabasco sauce. Premade or packaged marinades. Premade or packaged taco seasonings. Relishes. Regular salad dressings. Where to find more information:  National Heart, Lung, and [place]: https://wilson-eaton.com/  American Heart Association: www.heart.org Summary  The DASH eating plan is a healthy eating plan that has been shown to reduce high blood pressure (hypertension). It may also reduce your risk for type 2 diabetes, heart disease, and stroke.  With the DASH eating plan, you should limit  salt (sodium) intake to 2,300 mg a day. If you have hypertension, you may need to reduce your sodium intake to 1,500 mg a day.  When on the DASH eating plan, aim to eat more fresh fruits and vegetables, whole grains, lean proteins, low-fat dairy, and heart-healthy fats.  Work with your health care provider or diet and nutrition specialist (dietitian) to adjust your eating plan to your individual calorie needs. This information is not intended  to replace advice given to you by your health care provider. Make sure you discuss any questions you have with your health care provider. Document Released: 03/22/2011 Document Revised: 03/26/2016 Document Reviewed: 03/26/2016 Elsevier Interactive Patient Education  2019 [employer].

## 2018-05-14 ENCOUNTER — Other Ambulatory Visit (INDEPENDENT_AMBULATORY_CARE_PROVIDER_SITE_OTHER): Payer: PRIVATE HEALTH INSURANCE

## 2018-05-14 DIAGNOSIS — R7309 Other abnormal glucose: Secondary | ICD-10-CM | POA: Diagnosis not present

## 2018-05-14 LAB — HEMOGLOBIN A1C: Hgb A1c MFr Bld: 6.7 % — ABNORMAL HIGH (ref 4.6–6.5)

## 2018-05-15 ENCOUNTER — Telehealth: Payer: Self-pay | Admitting: Physician Assistant

## 2018-05-15 NOTE — Telephone Encounter (Signed)
See result note.  

## 2018-05-15 NOTE — Telephone Encounter (Signed)
Copied from [place] [id number]. Topic: Quick Communication - Lab Results (Clinic Use ONLY) >> May 15, 2018 12:46 PM Leonidas Romberg, CMA wrote: Called patient to inform them of lab results. When patient returns call, triage nurse may disclose results. >> May 15, 2018  1:40 PM Bea Graff, NT wrote: Pt returning call for lab results. May leave detailed message with results.

## 2018-05-19 ENCOUNTER — Encounter: Payer: Self-pay | Admitting: Physician Assistant

## 2018-05-19 ENCOUNTER — Ambulatory Visit (INDEPENDENT_AMBULATORY_CARE_PROVIDER_SITE_OTHER): Payer: PRIVATE HEALTH INSURANCE | Admitting: Physician Assistant

## 2018-05-19 ENCOUNTER — Other Ambulatory Visit: Payer: Self-pay

## 2018-05-19 VITALS — BP 122/82 | HR 74 | Temp 98.2°F | Resp 16 | Ht 69.0 in | Wt 219.0 lb

## 2018-05-19 DIAGNOSIS — E119 Type 2 diabetes mellitus without complications: Secondary | ICD-10-CM | POA: Diagnosis not present

## 2018-05-19 LAB — MICROALBUMIN / CREATININE URINE RATIO
Creatinine,U: 93.2 mg/dL
Microalb Creat Ratio: 0.8 mg/g (ref 0.0–30.0)
Microalb, Ur: <0.7 mg/dL (ref 0.0–1.9)

## 2018-05-19 MED ORDER — PANTOPRAZOLE SODIUM 40 MG PO TBEC: 40.0000 mg | DELAYED_RELEASE_TABLET | Freq: Every day | ORAL | 3 refills | Status: DC

## 2018-05-19 NOTE — Patient Instructions (Signed)
Please go to the lab today for blood work.  I will call you with your results. We will alter treatment regimen(s) if indicated by your results.   Please work on getting exercise to a goal of 150 minutes per week. Follow the recommendations below for diet. Limit those carbs. Follow-up with me in 3 months.    Diabetes Mellitus and Nutrition, Adult When you have diabetes (diabetes mellitus), it is very important to have healthy eating habits because your blood sugar (glucose) levels are greatly affected by what you eat and drink. Eating healthy foods in the appropriate amounts, at about the same times every day, can help you:  Control your blood glucose.  Lower your risk of heart disease.  Improve your blood pressure.  Reach or maintain a healthy weight. Every person with diabetes is different, and each person has different needs for a meal plan. Your health care provider may recommend that you work with a diet and nutrition specialist (dietitian) to make a meal plan that is best for you. Your meal plan may vary depending on factors such as:  The calories you need.  The medicines you take.  Your weight.  Your blood glucose, blood pressure, and cholesterol levels.  Your activity level.  Other health conditions you have, such as heart or kidney disease. How do carbohydrates affect me? Carbohydrates, also called carbs, affect your blood glucose level more than any other type of food. Eating carbs naturally raises the amount of glucose in your blood. Carb counting is a method for keeping track of how many carbs you eat. Counting carbs is important to keep your blood glucose at a healthy level, especially if you use insulin or take certain oral diabetes medicines. It is important to know how many carbs you can safely have in each meal. This is different for every person. Your dietitian can help you calculate how many carbs you should have at each meal and for each snack. Foods that  contain carbs include:  Bread, cereal, rice, pasta, and crackers.  Potatoes and corn.  Peas, beans, and lentils.  Milk and yogurt.  Fruit and juice.  Desserts, such as cakes, cookies, ice cream, and candy. How does alcohol affect me? Alcohol can cause a sudden decrease in blood glucose (hypoglycemia), especially if you use insulin or take certain oral diabetes medicines. Hypoglycemia can be a life-threatening condition. Symptoms of hypoglycemia (sleepiness, dizziness, and confusion) are similar to symptoms of having too much alcohol. If your health care provider says that alcohol is safe for you, follow these guidelines:  Limit alcohol intake to no more than 1 drink per day for nonpregnant women and 2 drinks per day for men. One drink equals 12 oz of beer, 5 oz of wine, or 1 oz of hard liquor.  Do not drink on an empty stomach.  Keep yourself hydrated with water, diet soda, or unsweetened iced tea.  Keep in mind that regular soda, juice, and other mixers may contain a lot of sugar and must be counted as carbs. What are tips for following this plan?  Reading food labels  Start by checking the serving size on the "Nutrition Facts" label of packaged foods and drinks. The amount of calories, carbs, fats, and other nutrients listed on the label is based on one serving of the item. Many items contain more than one serving per package.  Check the total grams (g) of carbs in one serving. You can calculate the number of servings of carbs in  one serving by dividing the total carbs by 15. For example, if a food has 30 g of total carbs, it would be equal to 2 servings of carbs.  Check the number of grams (g) of saturated and trans fats in one serving. Choose foods that have low or no amount of these fats.  Check the number of milligrams (mg) of salt (sodium) in one serving. Most people should limit total sodium intake to less than 2,300 mg per day.  Always check the nutrition information of  foods labeled as "low-fat" or "nonfat". These foods may be higher in added sugar or refined carbs and should be avoided.  Talk to your dietitian to identify your daily goals for nutrients listed on the label. Shopping  Avoid buying canned, premade, or processed foods. These foods tend to be high in fat, sodium, and added sugar.  Shop around the outside edge of the grocery store. This includes fresh fruits and vegetables, bulk grains, fresh meats, and fresh dairy. Cooking  Use low-heat cooking methods, such as baking, instead of high-heat cooking methods like deep frying.  Cook using healthy oils, such as olive, canola, or sunflower oil.  Avoid cooking with butter, cream, or high-fat meats. Meal planning  Eat meals and snacks regularly, preferably at the same times every day. Avoid going long periods of time without eating.  Eat foods high in fiber, such as fresh fruits, vegetables, beans, and whole grains. Talk to your dietitian about how many servings of carbs you can eat at each meal.  Eat 4-6 ounces (oz) of lean protein each day, such as lean meat, chicken, fish, eggs, or tofu. One oz of lean protein is equal to: ? 1 oz of meat, chicken, or fish. ? 1 egg. ?  cup of tofu.  Eat some foods each day that contain healthy fats, such as avocado, nuts, seeds, and fish. Lifestyle  Check your blood glucose regularly.  Exercise regularly as told by your health care provider. This may include: ? 150 minutes of moderate-intensity or vigorous-intensity exercise each week. This could be brisk walking, biking, or water aerobics. ? Stretching and doing strength exercises, such as yoga or weightlifting, at least 2 times a week.  Take medicines as told by your health care provider.  Do not use any products that contain nicotine or tobacco, such as cigarettes and e-cigarettes. If you need help quitting, ask your health care provider.  Work with a Social worker or diabetes educator to identify  strategies to manage stress and any emotional and social challenges. Questions to ask a health care provider  Do I need to meet with a diabetes educator?  Do I need to meet with a dietitian?  What number can I call if I have questions?  When are the best times to check my blood glucose? Where to find more information:  American Diabetes Association: diabetes.org  Academy of Nutrition and Dietetics: www.eatright.[employer] of Diabetes and Digestive and Kidney Diseases (NIH): [employer].dk Summary  A healthy meal plan will help you control your blood glucose and maintain a healthy lifestyle.  Working with a diet and nutrition specialist (dietitian) can help you make a meal plan that is best for you.  Keep in mind that carbohydrates (carbs) and alcohol have immediate effects on your blood glucose levels. It is important to count carbs and to use alcohol carefully. This information is not intended to replace advice given to you by your health care provider. Make sure you discuss any  questions you have with your health care provider. Document Released: 12/28/2004 Document Revised: 10/31/2016 Document Reviewed: 05/07/2016 Elsevier Interactive Patient Education  2019 [place].   Diabetes Mellitus and Exercise Exercising regularly is important for your overall health, especially when you have diabetes (diabetes mellitus). Exercising is not only about losing weight. It has many other health benefits, such as increasing muscle strength and bone density and reducing body fat and stress. This leads to improved fitness, flexibility, and endurance, all of which result in better overall health. Exercise has additional benefits for people with diabetes, including:  Reducing appetite.  Helping to lower and control blood glucose.  Lowering blood pressure.  Helping to control amounts of fatty substances (lipids) in the blood, such as cholesterol and triglycerides.  Helping  the body to respond better to insulin (improving insulin sensitivity).  Reducing how much insulin the body needs.  Decreasing the risk for heart disease by: ? Lowering cholesterol and triglyceride levels. ? Increasing the levels of good cholesterol. ? Lowering blood glucose levels. What is my activity plan? Your health care provider or certified diabetes educator can help you make a plan for the type and frequency of exercise (activity plan) that works for you. Make sure that you:  Do at least 150 minutes of moderate-intensity or vigorous-intensity exercise each week. This could be brisk walking, biking, or water aerobics. ? Do stretching and strength exercises, such as yoga or weightlifting, at least 2 times a week. ? Spread out your activity over at least 3 days of the week.  Get some form of physical activity every day. ? Do not go more than 2 days in a row without some kind of physical activity. ? Avoid being inactive for more than 30 minutes at a time. Take frequent breaks to walk or stretch.  Choose a type of exercise or activity that you enjoy, and set realistic goals.  Start slowly, and gradually increase the intensity of your exercise over time. What do I need to know about managing my diabetes?   Check your blood glucose before and after exercising. ? If your blood glucose is 240 mg/dL (13.3 mmol/L) or higher before you exercise, check your urine for ketones. If you have ketones in your urine, do not exercise until your blood glucose returns to normal. ? If your blood glucose is 100 mg/dL (5.6 mmol/L) or lower, eat a snack containing 15-20 grams of carbohydrate. Check your blood glucose 15 minutes after the snack to make sure that your level is above 100 mg/dL (5.6 mmol/L) before you start your exercise.  Know the symptoms of low blood glucose (hypoglycemia) and how to treat it. Your risk for hypoglycemia increases during and after exercise. Common symptoms of hypoglycemia can  include: ? Hunger. ? Anxiety. ? Sweating and feeling clammy. ? Confusion. ? Dizziness or feeling light-headed. ? Increased heart rate or palpitations. ? Blurry vision. ? Tingling or numbness around the mouth, lips, or tongue. ? Tremors or shakes. ? Irritability.  Keep a rapid-acting carbohydrate snack available before, during, and after exercise to help prevent or treat hypoglycemia.  Avoid injecting insulin into areas of the body that are going to be exercised. For example, avoid injecting insulin into: ? The arms, when playing tennis. ? The legs, when jogging.  Keep records of your exercise habits. Doing this can help you and your health care provider adjust your diabetes management plan as needed. Write down: ? Food that you eat before and after you exercise. ? Blood  glucose levels before and after you exercise. ? The type and amount of exercise you have done. ? When your insulin is expected to peak, if you use insulin. Avoid exercising at times when your insulin is peaking.  When you start a new exercise or activity, work with your health care provider to make sure the activity is safe for you, and to adjust your insulin, medicines, or food intake as needed.  Drink plenty of water while you exercise to prevent dehydration or heat stroke. Drink enough fluid to keep your urine clear or pale yellow. Summary  Exercising regularly is important for your overall health, especially when you have diabetes (diabetes mellitus).  Exercising has many health benefits, such as increasing muscle strength and bone density and reducing body fat and stress.  Your health care provider or certified diabetes educator can help you make a plan for the type and frequency of exercise (activity plan) that works for you.  When you start a new exercise or activity, work with your health care provider to make sure the activity is safe for you, and to adjust your insulin, medicines, or food intake as  needed. This information is not intended to replace advice given to you by your health care provider. Make sure you discuss any questions you have with your health care provider. Document Released: 06/23/2003 Document Revised: 10/11/2016 Document Reviewed: 09/12/2015 Elsevier Interactive Patient Education  2019 [employer].

## 2018-05-19 NOTE — Progress Notes (Signed)
History of Present Illness: Patient is a 63 y.o. male who presents to clinic today for follow-up of Diabetes Mellitus II, recently diagnosed.  He is here today to discuss the disease, preventive measures and treatment options.  Latest Maintenance: A1C --  Lab Results  Component Value Date   HGBA1C 6.7 (H) 05/14/2018   Diabetic Eye Exam -- Just had eye exam in [facility] a few days ago.  Urine Microalbumin -- Due. Is on ACEI. Foot Exam -- Due. Denies concerns.  Past Medical History:  Diagnosis Date  . Anxiety   . History of chickenpox   . Hypertension     Current Outpatient Medications on File Prior to Visit  Medication Sig Dispense Refill  . lisinopril (PRINIVIL,ZESTRIL) 10 MG tablet Take 10 mg by mouth daily.    Marland Kitchen LORazepam (ATIVAN) 0.5 MG tablet Take 1 tablet by mouth every 6 (six) hours as needed.    . vitamin B-12 (CYANOCOBALAMIN) 500 MCG tablet Take 500 mcg by mouth daily.     No current facility-administered medications on file prior to visit.     No Known Allergies  Family History  Problem Relation Age of Onset  . Cancer Mother   . Anxiety disorder Mother   . Diabetes Mother   . Alcohol abuse Father   . Heart disease Father   . Heart attack Father 28  . Cancer Sister   . Diabetes Brother   . Cancer Sister   . Cancer Sister     Social History   Socioeconomic History  . Marital status: Married    Spouse name: Not on file  . Number of children: Not on file  . Years of education: Not on file  . Highest education level: Not on file  Occupational History  . Not on file  Social Needs  . Financial resource strain: Not on file  . Food insecurity:    Worry: Not on file    Inability: Not on file  . Transportation needs:    Medical: Not on file    Non-medical: Not on file  Tobacco Use  . Smoking status: Never Smoker  . Smokeless tobacco: Never Used  Substance and Sexual Activity  . Alcohol use: Not Currently  . Drug use: Not Currently  . Sexual  activity: Yes  Lifestyle  . Physical activity:    Days per week: Not on file    Minutes per session: Not on file  . Stress: Not on file  Relationships  . Social connections:    Talks on phone: Not on file    Gets together: Not on file    Attends religious service: Not on file    Active member of club or organization: Not on file    Attends meetings of clubs or organizations: Not on file    Relationship status: Not on file  Other Topics Concern  . Not on file  Social History Narrative  . Not on file   Review of Systems: Pertinent ROS are listed in HPI  Physical Examination: BP 122/82   Pulse 74   Temp 98.2 F (36.8 C) (Oral)   Resp 16   Ht 5\' 9"  (1.753 m)   Wt 219 lb (99.3 kg)   SpO2 98%   BMI 32.34 kg/m  General appearance: alert, cooperative, appears stated age and no distress Ears: normal TM's and external ear canals both ears Nose: Nares normal. Septum midline. Mucosa normal. No drainage or sinus tenderness. Throat: lips, mucosa, and tongue normal;  teeth and gums normal Lungs: clear to auscultation bilaterally Heart: regular rate and rhythm, S1, S2 normal, no murmur, click, rub or gallop Extremities: extremities normal, atraumatic, no cyanosis or edema  Diabetic Foot Exam - Simple   Simple Foot Form Visual Inspection No deformities, no ulcerations, no other skin breakdown bilaterally:  Yes Sensation Testing See comments:  Yes Pulse Check Posterior Tibialis and Dorsalis pulse intact bilaterally:  Yes Comments Decreased sensation with monofilament testing of great toes bilaterally. Otherwise sensation is intact.      Assessment/Plan: New onset type 2 diabetes mellitus (HCC) A1C at 6.7. No indication for medication presently as patient already working on TLC. Reviewed proper low-carb diet and meal planning. Discussed exercise goals. Foot exam with decreased sensation of great toes bilaterally. Otherwise in normal limits. Will monitor every 6 months. Had eye  exam in Summer. Will obtain records from his OP. Continue lisinopril. Will check urine microalbumin today. Is giving thought to Pneumonia vaccine. Still declines statin for hyperlipidemia. 3 month follow-up scheduled.

## 2018-05-20 DIAGNOSIS — E119 Type 2 diabetes mellitus without complications: Secondary | ICD-10-CM | POA: Insufficient documentation

## 2018-05-20 NOTE — Assessment & Plan Note (Signed)
A1C at 6.7. No indication for medication presently as patient already working on TLC. Reviewed proper low-carb diet and meal planning. Discussed exercise goals. Foot exam with decreased sensation of great toes bilaterally. Otherwise in normal limits. Will monitor every 6 months. Had eye exam in Summer. Will obtain records from his OP. Continue lisinopril. Will check urine microalbumin today. Is giving thought to Pneumonia vaccine. Still declines statin for hyperlipidemia. 3 month follow-up scheduled.

## 2018-07-16 ENCOUNTER — Other Ambulatory Visit: Payer: Self-pay | Admitting: Physician Assistant

## 2018-07-16 MED ORDER — LISINOPRIL 10 MG PO TABS: 10.0000 mg | ORAL_TABLET | Freq: Every day | ORAL | 0 refills | Status: DC

## 2018-07-22 ENCOUNTER — Telehealth: Payer: Self-pay | Admitting: Physician Assistant

## 2018-07-22 NOTE — Telephone Encounter (Signed)
Reviewing metrics.  Patient is showing overdue for his diabetic eye examination and we have not received any records from Ophthalmology for him. Please see if he has had eye exam within past 12 months. If so where and with whom? If not, will need to schedule. Ok to place referral if willing.   He is overdue for colonoscopy. He needs to make sure to schedule this with GI provider once things calm down with COVID.   Will complete foot examination at next OV.

## 2018-07-28 NOTE — Telephone Encounter (Signed)
LMOVM advising the name/location of recent eye exam.

## 2018-11-07 ENCOUNTER — Telehealth: Payer: Self-pay | Admitting: *Deleted

## 2018-11-07 NOTE — Telephone Encounter (Signed)
Patient left a message on VM asking that David Dalton or his nurse to call him.  He did not leave a detailed message. I called back and he did not answer.

## 2018-11-19 ENCOUNTER — Other Ambulatory Visit: Payer: Self-pay

## 2018-11-19 ENCOUNTER — Encounter: Payer: Self-pay | Admitting: Physician Assistant

## 2018-11-19 ENCOUNTER — Ambulatory Visit (INDEPENDENT_AMBULATORY_CARE_PROVIDER_SITE_OTHER): Payer: PRIVATE HEALTH INSURANCE | Admitting: Physician Assistant

## 2018-11-19 VITALS — BP 140/82 | HR 71 | Temp 98.4°F | Resp 16 | Ht 69.0 in | Wt 219.0 lb

## 2018-11-19 DIAGNOSIS — B351 Tinea unguium: Secondary | ICD-10-CM | POA: Diagnosis not present

## 2018-11-19 DIAGNOSIS — E119 Type 2 diabetes mellitus without complications: Secondary | ICD-10-CM

## 2018-11-19 DIAGNOSIS — N4889 Other specified disorders of penis: Secondary | ICD-10-CM | POA: Diagnosis not present

## 2018-11-19 DIAGNOSIS — B3742 Candidal balanitis: Secondary | ICD-10-CM | POA: Diagnosis not present

## 2018-11-19 DIAGNOSIS — Z1211 Encounter for screening for malignant neoplasm of colon: Secondary | ICD-10-CM

## 2018-11-19 LAB — POCT URINALYSIS DIPSTICK
Bilirubin, UA: NEGATIVE
Blood, UA: NEGATIVE
Glucose, UA: NEGATIVE
Ketones, UA: NEGATIVE
Leukocytes, UA: NEGATIVE
Nitrite, UA: NEGATIVE
Protein, UA: POSITIVE — AB
Spec Grav, UA: 1.015 (ref 1.010–1.025)
Urobilinogen, UA: 0.2 E.U./dL
pH, UA: 6.5 (ref 5.0–8.0)

## 2018-11-19 MED ORDER — CLOTRIMAZOLE 1 % EX CREA: 1.0000 "application " | TOPICAL_CREAM | Freq: Two times a day (BID) | CUTANEOUS | 0 refills | Status: DC

## 2018-11-19 NOTE — Progress Notes (Signed)
KGU:RKYHCW, Luanna Cole, PA-C  Current Issues:  Presents with 7 days of burning sensation and redness at the end of penis, at urethral meatus. Some irritation of penil shaft as well. Denies urinary urgency, frequency, hematuria, hesitancy,etc. Denies penile swelling or discharge. Denies scrotal/testicular pain or swelling.   There is no history of of similar symptoms. Sexually active:  No   No concern for STI.  Patient also endorses thickening and discoloration of toenails x several months. Does work in [employer], Social research officer, government. Wears boots but no socks. Is a diabetic with Last a!c < 7. Has very mild peripheral neuropathy, not requiring treatment. Denies worsening of this. Is overdue for diabetic foot examination. Also due for repeat eye exam and labs. Is trying to watch diet and keep active.   Patient also due for repeat screening colonoscopy. Denies symptoms today. Average risk.  Prior to Admission medications   Medication Sig Start Date End Date Taking? Authorizing Provider  lisinopril (PRINIVIL,ZESTRIL) 10 MG tablet Take 1 tablet (10 mg total) by mouth daily. 07/16/18 10/14/18  Brunetta Jeans, PA-C  pantoprazole (PROTONIX) 40 MG tablet Take 1 tablet (40 mg total) by mouth daily. 05/19/18   Brunetta Jeans, PA-C  vitamin B-12 (CYANOCOBALAMIN) 500 MCG tablet Take 500 mcg by mouth daily.    [provider]   Review of Systems:Pertinent ROS are listed in the HPI.  PE:  BP 140/82   Pulse 71   Temp 98.4 F (36.9 C) (Skin)   Resp 16   Ht 5\' 9"  (1.753 m)   Wt 219 lb (99.3 kg)   SpO2 98%   BMI 32.34 kg/m  Physical Exam  Constitutional: He is oriented to person, place, and time and well-developed, well-nourished, and in no distress.  HENT:  Head: Normocephalic and atraumatic.  Eyes: Conjunctivae are normal.  Cardiovascular: Normal rate and regular rhythm.  Pulmonary/Chest: Effort normal and breath sounds normal.  Genitourinary: He exhibits no abnormal testicular mass and no abnormal scrotal  mass. Penis exhibits no edema. No discharge found.    Genitourinary Comments: Patient is uncircumcised. There is irritation of foreskin and at the urethral meatus with redness and shining. Concern for candida. No lesion of inguinal region noted on exam. Foreskin was put back in place at end of exam to prevent risk of paraphimosis.    Neurological: He is alert and oriented to person, place, and time.  Vitals reviewed.  Diabetic Foot Form - Detailed   Diabetic Foot Exam - detailed Diabetic Foot exam was performed with the following findings: Yes 11/19/2018 11:01 AM  Visual Foot Exam completed.: Yes  Can the patient see the bottom of their feet?: Yes Are the shoes appropriate in style and fit?: Yes Is there swelling or and abnormal foot shape?: No Is there a claw toe deformity?: No Is there elevated skin temparature?: No Is there foot or ankle muscle weakness?: No Normal Range of Motion: Yes Pulse Foot Exam completed.: Yes  Right posterior Tibialias: Present Left posterior Tibialias: Present  Right Dorsalis Pedis: Present Left Dorsalis Pedis: Present  Sensory Foot Exam Completed.: Yes Semmes-Weinstein Monofilament Test R Site 1-Great Toe: Pos L Site 1-Great Toe: Pos    Comments: Discoloration and thickening of nails noted, worse on great toenails bilaterally. Consistent with onychomycosis.      Results for orders placed or performed in visit on 11/19/18  POCT urinalysis dipstick  Result Value Ref Range   Color, UA yellow    Clarity, UA clear    Glucose, UA  Negative Negative   Bilirubin, UA negative    Ketones, UA negative    Spec Grav, UA 1.015 1.010 - 1.025   Blood, UA negative    pH, UA 6.5 5.0 - 8.0   Protein, UA Positive (A) Negative   Urobilinogen, UA 0.2 0.2 or 1.0 E.U./dL   Nitrite, UA negative    Leukocytes, UA Negative Negative   Appearance     Odor      Assessment and Plan:  1. Penile pain 2. Candidal balanitis Discomfort likely secondary to candida. Urine dip  with trace protein - recent BMP and microalbumin within normal limits. Repeat BMP today. Urine culture sent to make doubly sure infection excluded. Start Clotrimazole ointment BID. Hygiene practices reviewed. Follow-up if not improving.  3. New onset type 2 diabetes mellitus ([place]) Patient to schedule repeat eye exam. Foot exam completed with good sensation. Onychomycosis noted. Nail care review. Declines podiatry referral at present. Repeat labs today. - Comprehensive metabolic panel - Hemoglobin A1c - Lipid panel  4. Onychomycosis Declines pharmacotherapy or Podiatry. Will start vinegar soaks. Proper foot hygiene and use of cotton socks discussed.  5. Colon cancer screening Due for repeat colonoscopy. Asymptomatic. Referral placed to GI for this. - Ambulatory referral to Gastroenterology

## 2018-11-19 NOTE — Patient Instructions (Signed)
Instructions sent to MyChart.  Please apply the Clotrimazole ointment twice daily for 10 days. Keep the skin clean and dry. If not improving and culture negative we will send you to Urology. Keep hydrated.  For nails, soak in basin of equal parts warm water and distilled white vinegar for 10 minutes or so, 3 x week. Over time this can help with symptoms.  If not improving or if you change your mind, let me know and we can consider oral medication versus Podiatry assessment.  Call and schedule a follow-up with your eye doctor. You will be contacted for assessment by Gastroenterology for screening colonoscopy.

## 2018-11-20 ENCOUNTER — Ambulatory Visit (INDEPENDENT_AMBULATORY_CARE_PROVIDER_SITE_OTHER): Payer: PRIVATE HEALTH INSURANCE | Admitting: *Deleted

## 2018-11-20 DIAGNOSIS — E119 Type 2 diabetes mellitus without complications: Secondary | ICD-10-CM | POA: Diagnosis not present

## 2018-11-20 DIAGNOSIS — N4889 Other specified disorders of penis: Secondary | ICD-10-CM

## 2018-11-20 LAB — COMPREHENSIVE METABOLIC PANEL
ALT: 26 U/L (ref 0–53)
AST: 20 U/L (ref 0–37)
Albumin: 4.2 g/dL (ref 3.5–5.2)
Alkaline Phosphatase: 78 U/L (ref 39–117)
BUN: 18 mg/dL (ref 6–23)
CO2: 29 mEq/L (ref 19–32)
Calcium: 9.6 mg/dL (ref 8.4–10.5)
Chloride: 103 mEq/L (ref 96–112)
Creatinine, Ser: 1.09 mg/dL (ref 0.40–1.50)
GFR: 67.98 mL/min (ref >60.00)
Glucose, Bld: 136 mg/dL — ABNORMAL HIGH (ref 70–99)
Potassium: 4.8 mEq/L (ref 3.5–5.1)
Sodium: 139 mEq/L (ref 135–145)
Total Bilirubin: 0.5 mg/dL (ref 0.2–1.2)
Total Protein: 6.4 g/dL (ref 6.0–8.3)

## 2018-11-20 LAB — LIPID PANEL
Cholesterol: 271 mg/dL — ABNORMAL HIGH (ref 0–200)
HDL: 36.1 mg/dL — ABNORMAL LOW (ref >39.00)
NonHDL: 234.95
Total CHOL/HDL Ratio: 8
Triglycerides: 303 mg/dL — ABNORMAL HIGH (ref 0.0–149.0)
VLDL: 60.6 mg/dL — ABNORMAL HIGH (ref 0.0–40.0)

## 2018-11-20 LAB — HEMOGLOBIN A1C: Hgb A1c MFr Bld: 6.7 % — ABNORMAL HIGH (ref 4.6–6.5)

## 2018-11-20 LAB — LDL CHOLESTEROL, DIRECT: Direct LDL: 178 mg/dL

## 2018-11-20 NOTE — Addendum Note (Signed)
Addended by: Katina Dung on: 11/20/2018 08:07 AM   Modules accepted: Orders

## 2018-11-21 ENCOUNTER — Other Ambulatory Visit (INDEPENDENT_AMBULATORY_CARE_PROVIDER_SITE_OTHER): Payer: PRIVATE HEALTH INSURANCE

## 2018-11-21 DIAGNOSIS — E291 Testicular hypofunction: Secondary | ICD-10-CM

## 2018-11-21 LAB — URINE CULTURE
MICRO NUMBER:: 740830
Result:: NO GROWTH
SPECIMEN QUALITY:: ADEQUATE

## 2018-11-21 LAB — TESTOSTERONE: Testosterone: 204.54 ng/dL — ABNORMAL LOW (ref 300.00–890.00)

## 2018-11-22 LAB — URINE CULTURE
MICRO NUMBER:: 745715
Result:: NO GROWTH
SPECIMEN QUALITY:: ADEQUATE

## 2018-11-25 ENCOUNTER — Telehealth: Payer: Self-pay | Admitting: Physician Assistant

## 2018-11-25 DIAGNOSIS — I10 Essential (primary) hypertension: Secondary | ICD-10-CM

## 2018-11-25 MED ORDER — LISINOPRIL 10 MG PO TABS: 10.0000 mg | ORAL_TABLET | Freq: Every day | ORAL | 0 refills | Status: DC

## 2018-11-25 NOTE — Telephone Encounter (Signed)
Medication Refill - Medication: lisinopril  Has the patient contacted their pharmacy? No. (Agent: If no, request that the patient contact the pharmacy for the refill.) (Agent: If yes, when and what did the pharmacy advise?)  Preferred Pharmacy (with phone number or street name):  [place], [place] - [address]  [address] [place] [address]  Phone: [phone] Fax: [phone]  Not a 24 hour pharmacy; exact hours not known.     Agent: Please be advised that RX refills may take up to 3 business days. We ask that you follow-up with your pharmacy.

## 2018-11-25 NOTE — Telephone Encounter (Signed)
Patient medication refilled to the pharmacy

## 2018-12-24 ENCOUNTER — Ambulatory Visit (INDEPENDENT_AMBULATORY_CARE_PROVIDER_SITE_OTHER): Payer: PRIVATE HEALTH INSURANCE | Admitting: Physician Assistant

## 2018-12-24 ENCOUNTER — Other Ambulatory Visit: Payer: Self-pay

## 2018-12-24 ENCOUNTER — Encounter: Payer: Self-pay | Admitting: Physician Assistant

## 2018-12-24 VITALS — BP 120/78 | HR 95 | Temp 98.5°F | Resp 16 | Ht 69.0 in | Wt 217.0 lb

## 2018-12-24 DIAGNOSIS — L255 Unspecified contact dermatitis due to plants, except food: Secondary | ICD-10-CM | POA: Diagnosis not present

## 2018-12-24 MED ORDER — PREDNISONE 10 MG PO TABS: ORAL_TABLET | ORAL | 0 refills | Status: AC

## 2018-12-24 MED ORDER — TRIAMCINOLONE ACETONIDE 0.1 % EX CREA: 1.0000 "application " | TOPICAL_CREAM | Freq: Two times a day (BID) | CUTANEOUS | 0 refills | Status: DC

## 2018-12-24 NOTE — Progress Notes (Signed)
Patient presents to clinic today c/o poison ivy rash of upper and lower extremities bilaterally x 1 week after trimming bushes at home and accidentally getting into poison ivy vines. Notes rash developed later the same day. Has been keeping skin clean and dry, applying oatmeal paste to the area to help with rash but without significant improvement. Denies fever, chills, malaise. Denies SOB.  Past Medical History:  Diagnosis Date  . Anxiety   . History of chickenpox   . Hypertension     Current Outpatient Medications on File Prior to Visit  Medication Sig Dispense Refill  . clotrimazole (CLOTRIMAZOLE ANTI-FUNGAL) 1 % cream Apply 1 application topically 2 (two) times daily. 30 g 0  . lisinopril (ZESTRIL) 10 MG tablet Take 1 tablet (10 mg total) by mouth daily. 90 tablet 0  . vitamin B-12 (CYANOCOBALAMIN) 500 MCG tablet Take 500 mcg by mouth 2 (two) times a week.      No current facility-administered medications on file prior to visit.     No Known Allergies  Family History  Problem Relation Age of Onset  . Cancer Mother   . Anxiety disorder Mother   . Diabetes Mother   . Alcohol abuse Father   . Heart disease Father   . Heart attack Father 83  . Cancer Sister   . Diabetes Brother   . Cancer Sister   . Cancer Sister     Social History   Socioeconomic History  . Marital status: Married    Spouse name: Not on file  . Number of children: Not on file  . Years of education: Not on file  . Highest education level: Not on file  Occupational History  . Not on file  Social Needs  . Financial resource strain: Not on file  . Food insecurity    Worry: Not on file    Inability: Not on file  . Transportation needs    Medical: Not on file    Non-medical: Not on file  Tobacco Use  . Smoking status: Never Smoker  . Smokeless tobacco: Never Used  Substance and Sexual Activity  . Alcohol use: Not Currently  . Drug use: Not Currently  . Sexual activity: Yes  Lifestyle  .  Physical activity    Days per week: Not on file    Minutes per session: Not on file  . Stress: Not on file  Relationships  . Social Herbalist on phone: Not on file    Gets together: Not on file    Attends religious service: Not on file    Active member of club or organization: Not on file    Attends meetings of clubs or organizations: Not on file    Relationship status: Not on file  Other Topics Concern  . Not on file  Social History Narrative  . Not on file    Review of Systems - See HPI.  All other ROS are negative.  BP 120/78   Pulse 95   Temp 98.5 F (36.9 C) (Skin)   Resp 16   Ht 5\' 9"  (1.753 m)   Wt 217 lb (98.4 kg)   SpO2 97%   BMI 32.05 kg/m   Physical Exam Vitals signs reviewed.  Constitutional:      Appearance: Normal appearance.  Neck:     Musculoskeletal: Neck supple.  Cardiovascular:     Rate and Rhythm: Normal rate and regular rhythm.     Pulses: Normal pulses.  Heart sounds: Normal heart sounds.  Pulmonary:     Effort: Pulmonary effort is normal.     Breath sounds: Normal breath sounds.  Skin:    Comments: Papulovesicular rash on erythematous base of distal arms and legs bilaterally. Significant areas of confluence on the arms measuring up the bulk of lateral dorsal surface of forearms to elbows. Scattered lesions noted of proximal lower extremities bilaterally. Similar lesions of lateral forehead noted.   Neurological:     Mental Status: He is alert.     Recent Results (from the past 2160 hour(s))  POCT urinalysis dipstick     Status: Abnormal   Collection Time: 11/19/18 10:02 AM  Result Value Ref Range   Color, UA yellow    Clarity, UA clear    Glucose, UA Negative Negative   Bilirubin, UA negative    Ketones, UA negative    Spec Grav, UA 1.015 1.010 - 1.025   Blood, UA negative    pH, UA 6.5 5.0 - 8.0   Protein, UA Positive (A) Negative   Urobilinogen, UA 0.2 0.2 or 1.0 E.U./dL   Nitrite, UA negative    Leukocytes, UA  Negative Negative   Appearance     Odor    Urine Culture     Status: None   Collection Time: 11/19/18 10:03 AM  Result Value Ref Range   MICRO NUMBER: [id number]    SPECIMEN QUALITY: Adequate    Sample Source NOT GIVEN    STATUS: FINAL    Result: No Growth   Lipid panel     Status: Abnormal   Collection Time: 11/20/18  8:08 AM  Result Value Ref Range   Cholesterol 271 (H) 0 - 200 mg/dL    Comment: ATP III Classification       Desirable:  < 200 mg/dL               Borderline High:  200 - 239 mg/dL          High:  > = 240 mg/dL   Triglycerides 303.0 (H) 0.0 - 149.0 mg/dL    Comment: Normal:  <150 mg/dLBorderline High:  150 - 199 mg/dL   HDL 36.10 (L) >39.00 mg/dL   VLDL 60.6 (H) 0.0 - 40.0 mg/dL   Total CHOL/HDL Ratio 8     Comment:                Men          Women1/2 Average Risk     3.4          3.3Average Risk          5.0          4.42X Average Risk          9.6          7.13X Average Risk          15.0          11.0                       NonHDL 234.95     Comment: NOTE:  Non-HDL goal should be 30 mg/dL higher than patient's LDL goal (i.e. LDL goal of < 70 mg/dL, would have non-HDL goal of < 100 mg/dL)  Hemoglobin A1c     Status: Abnormal   Collection Time: 11/20/18  8:08 AM  Result Value Ref Range   Hgb A1c MFr Bld 6.7 (H) 4.6 - 6.5 %  Comment: Glycemic Control Guidelines for People with Diabetes:Non Diabetic:  <6%Goal of Therapy: <7%Additional Action Suggested:  >8%   Comprehensive metabolic panel     Status: Abnormal   Collection Time: 11/20/18  8:08 AM  Result Value Ref Range   Sodium 139 135 - 145 mEq/L   Potassium 4.8 3.5 - 5.1 mEq/L   Chloride 103 96 - 112 mEq/L   CO2 29 19 - 32 mEq/L   Glucose, Bld 136 (H) 70 - 99 mg/dL   BUN 18 6 - 23 mg/dL   Creatinine, Ser 1.09 0.40 - 1.50 mg/dL   Total Bilirubin 0.5 0.2 - 1.2 mg/dL   Alkaline Phosphatase 78 39 - 117 U/L   AST 20 0 - 37 U/L   ALT 26 0 - 53 U/L   Total Protein 6.4 6.0 - 8.3 g/dL   Albumin 4.2 3.5 - 5.2 g/dL    Calcium 9.6 8.4 - 10.5 mg/dL   GFR 67.98 >60.00 mL/min  LDL cholesterol, direct     Status: None   Collection Time: 11/20/18  8:08 AM  Result Value Ref Range   Direct LDL 178.0 mg/dL    Comment: Optimal:  <100 mg/dLNear or Above Optimal:  100-129 mg/dLBorderline High:  130-159 mg/dLHigh:  160-189 mg/dLVery High:  >190 mg/dL  Urine culture     Status: None   Collection Time: 11/20/18  8:16 AM   Specimen: Urine  Result Value Ref Range   MICRO NUMBER: [id number]    SPECIMEN QUALITY: Adequate    Sample Source NOT GIVEN    STATUS: FINAL    Result: No Growth   Testosterone     Status: Abnormal   Collection Time: 11/21/18  3:52 PM  Result Value Ref Range   Testosterone 204.54 (L) 300.00 - 890.00 ng/dL    Assessment/Plan: 1. Rhus dermatitis Significant rash of extremities. A few scattered lesions of face. Supportive measures reviewed. Start Kenalog cream to rash of extremities. Start Prednisone taper giving severity. Minimize carbs (DM II -- controlled). Keep hydrated. RTC if not resolving.  - predniSONE (DELTASONE) 10 MG tablet; Take 4 tablets (40 mg total) by mouth daily with breakfast for 3 days, THEN 3 tablets (30 mg total) daily with breakfast for 3 days, THEN 2 tablets (20 mg total) daily with breakfast for 3 days, THEN 1 tablet (10 mg total) daily with breakfast for 3 days.  Dispense: 30 tablet; Refill: 0 - triamcinolone cream (KENALOG) 0.1 %; Apply 1 application topically 2 (two) times daily.  Dispense: 30 g; Refill: 0   Leeanne Rio, PA-C

## 2018-12-24 NOTE — Patient Instructions (Signed)
Please keep skin clean and dry. Consider applying Witch hazel astringent to the area to help dry it up. Apply the prescription steroid cream (triamcinolone) twice daily. Take the steroid taper as directed.  Please let me know if symptoms are not resolving.

## 2018-12-25 ENCOUNTER — Ambulatory Visit: Payer: PRIVATE HEALTH INSURANCE | Admitting: Physician Assistant

## 2019-02-03 ENCOUNTER — Ambulatory Visit (INDEPENDENT_AMBULATORY_CARE_PROVIDER_SITE_OTHER): Payer: PRIVATE HEALTH INSURANCE | Admitting: Physician Assistant

## 2019-02-03 ENCOUNTER — Other Ambulatory Visit: Payer: Self-pay

## 2019-02-03 ENCOUNTER — Encounter: Payer: Self-pay | Admitting: Physician Assistant

## 2019-02-03 VITALS — BP 120/78 | HR 102 | Temp 100.6°F | Resp 16 | Ht 69.0 in | Wt 217.0 lb

## 2019-02-03 DIAGNOSIS — R10819 Abdominal tenderness, unspecified site: Secondary | ICD-10-CM

## 2019-02-03 DIAGNOSIS — R319 Hematuria, unspecified: Secondary | ICD-10-CM | POA: Diagnosis not present

## 2019-02-03 DIAGNOSIS — R300 Dysuria: Secondary | ICD-10-CM

## 2019-02-03 LAB — POCT URINALYSIS DIPSTICK
Bilirubin, UA: NEGATIVE
Glucose, UA: NEGATIVE
Ketones, UA: NEGATIVE
Nitrite, UA: POSITIVE
Protein, UA: POSITIVE — AB
Spec Grav, UA: 1.025 (ref 1.010–1.025)
Urobilinogen, UA: 0.2 E.U./dL
pH, UA: 6 (ref 5.0–8.0)

## 2019-02-03 MED ORDER — CIPROFLOXACIN HCL 500 MG PO TABS: 500.0000 mg | ORAL_TABLET | Freq: Two times a day (BID) | ORAL | 0 refills | Status: DC

## 2019-02-03 NOTE — Patient Instructions (Signed)
Your symptoms are consistent with a bladder infection, also called acute cystitis. Please take your antibiotic (Ciprofloxacin) as directed until all pills are gone.  Stay very well hydrated.  Consider a daily probiotic (Align, Culturelle, or Activia) to help prevent stomach upset caused by the antibiotic.  Taking a probiotic daily may also help prevent recurrent UTIs.  Also consider taking AZO (Phenazopyridine) tablets to help decrease pain with urination.  I will call you with your urine testing results.  We will change antibiotics if indicated.  Call or return to clinic if symptoms are not resolved by completion of antibiotic.   Urinary Tract Infection A urinary tract infection (UTI) can occur any place along the urinary tract. The tract includes the kidneys, ureters, bladder, and urethra. A type of germ called bacteria often causes a UTI. UTIs are often helped with antibiotic medicine.  HOME CARE   If given, take antibiotics as told by your doctor. Finish them even if you start to feel better.  Drink enough fluids to keep your pee (urine) clear or pale yellow.  Avoid tea, drinks with caffeine, and bubbly (carbonated) drinks.  Pee often. Avoid holding your pee in for a long time.  Pee before and after having sex (intercourse).  Wipe from front to back after you poop (bowel movement) if you are a woman. Use each tissue only once. GET HELP RIGHT AWAY IF:   You have back pain.  You have lower belly (abdominal) pain.  You have chills.  You feel sick to your stomach (nauseous).  You throw up (vomit).  Your burning or discomfort with peeing does not go away.  You have a fever.  Your symptoms are not better in 3 days. MAKE SURE YOU:   Understand these instructions.  Will watch your condition.  Will get help right away if you are not doing well or get worse. Document Released: 09/19/2007 Document Revised: 12/26/2011 Document Reviewed: 11/01/2011 [facility] ([facility]) Patient Information 2015  [place], [place]. This information is not intended to replace advice given to you by your health care provider. Make sure you discuss any questions you have with your health care provider.

## 2019-02-03 NOTE — Progress Notes (Signed)
Patient presents to clinic today c/o abdominal pain x 2 weeks. Was putting down flooring and moving furniture, noted abdominal discomfort the following day. Discomfort is generalized, located in the left and right sides of his abdomen. States his abdominal distension has not increased in size. Does not recall any mechanism of injury.   Dysuria over the past week. Associated symptoms include right-sided low back pain, frequency, right lower abdominal pain, and generalized body aches. States his myalgias have improved today. Denies urgency, hematuria, fever or chills, headache.    Past Medical History:  Diagnosis Date  . Anxiety   . History of chickenpox   . Hypertension     Current Outpatient Medications on File Prior to Visit  Medication Sig Dispense Refill  . clotrimazole (CLOTRIMAZOLE ANTI-FUNGAL) 1 % cream Apply 1 application topically 2 (two) times daily. 30 g 0  . lisinopril (ZESTRIL) 10 MG tablet Take 1 tablet (10 mg total) by mouth daily. 90 tablet 0  . triamcinolone cream (KENALOG) 0.1 % Apply 1 application topically 2 (two) times daily. 30 g 0  . vitamin B-12 (CYANOCOBALAMIN) 500 MCG tablet Take 500 mcg by mouth 2 (two) times a week.      No current facility-administered medications on file prior to visit.    No Known Allergies  Family History  Problem Relation Age of Onset  . Cancer Mother   . Anxiety disorder Mother   . Diabetes Mother   . Alcohol abuse Father   . Heart disease Father   . Heart attack Father 75  . Cancer Sister   . Diabetes Brother   . Cancer Sister   . Cancer Sister     Social History   Socioeconomic History  . Marital status: Married    Spouse name: Not on file  . Number of children: Not on file  . Years of education: Not on file  . Highest education level: Not on file  Occupational History  . Not on file  Social Needs  . Financial resource strain: Not on file  . Food insecurity    Worry: Not on file    Inability: Not on file  .  Transportation needs    Medical: Not on file    Non-medical: Not on file  Tobacco Use  . Smoking status: Never Smoker  . Smokeless tobacco: Never Used  Substance and Sexual Activity  . Alcohol use: Not Currently  . Drug use: Not Currently  . Sexual activity: Yes  Lifestyle  . Physical activity    Days per week: Not on file    Minutes per session: Not on file  . Stress: Not on file  Relationships  . Social Herbalist on phone: Not on file    Gets together: Not on file    Attends religious service: Not on file    Active member of club or organization: Not on file    Attends meetings of clubs or organizations: Not on file    Relationship status: Not on file  Other Topics Concern  . Not on file  Social History Narrative  . Not on file   Review of Systems - See HPI.  All other ROS are negative.  Wt 217 lb (98.4 kg)   BMI 32.05 kg/m   Physical Exam Vitals signs reviewed.  Constitutional:      Appearance: He is well-developed.  HENT:     Head: Normocephalic and atraumatic.     Mouth/Throat:     Mouth:  Mucous membranes are moist.  Eyes:     Pupils: Pupils are equal, round, and reactive to light.  Abdominal:     General: Bowel sounds are normal.     Tenderness: There is no abdominal tenderness. There is no right CVA tenderness or left CVA tenderness.     Hernia: There is no hernia in the umbilical area, ventral area, left inguinal area, right femoral area, left femoral area or right inguinal area.     Comments: + Diastasis Recti  Genitourinary:   Neurological:     Mental Status: He is alert.     Recent Results (from the past 2160 hour(s))  POCT urinalysis dipstick     Status: Abnormal   Collection Time: 11/19/18 10:02 AM  Result Value Ref Range   Color, UA yellow    Clarity, UA clear    Glucose, UA Negative Negative   Bilirubin, UA negative    Ketones, UA negative    Spec Grav, UA 1.015 1.010 - 1.025   Blood, UA negative    pH, UA 6.5 5.0 - 8.0    Protein, UA Positive (A) Negative   Urobilinogen, UA 0.2 0.2 or 1.0 E.U./dL   Nitrite, UA negative    Leukocytes, UA Negative Negative   Appearance     Odor    Urine Culture     Status: None   Collection Time: 11/19/18 10:03 AM  Result Value Ref Range   MICRO NUMBER: [id number]    SPECIMEN QUALITY: Adequate    Sample Source NOT GIVEN    STATUS: FINAL    Result: No Growth   Lipid panel     Status: Abnormal   Collection Time: 11/20/18  8:08 AM  Result Value Ref Range   Cholesterol 271 (H) 0 - 200 mg/dL    Comment: ATP III Classification       Desirable:  < 200 mg/dL               Borderline High:  200 - 239 mg/dL          High:  > = 240 mg/dL   Triglycerides 303.0 (H) 0.0 - 149.0 mg/dL    Comment: Normal:  <150 mg/dLBorderline High:  150 - 199 mg/dL   HDL 36.10 (L) >39.00 mg/dL   VLDL 60.6 (H) 0.0 - 40.0 mg/dL   Total CHOL/HDL Ratio 8     Comment:                Men          Women1/2 Average Risk     3.4          3.3Average Risk          5.0          4.42X Average Risk          9.6          7.13X Average Risk          15.0          11.0                       NonHDL 234.95     Comment: NOTE:  Non-HDL goal should be 30 mg/dL higher than patient's LDL goal (i.e. LDL goal of < 70 mg/dL, would have non-HDL goal of < 100 mg/dL)  Hemoglobin A1c     Status: Abnormal   Collection Time: 11/20/18  8:08 AM  Result Value Ref Range  Hgb A1c MFr Bld 6.7 (H) 4.6 - 6.5 %    Comment: Glycemic Control Guidelines for People with Diabetes:Non Diabetic:  <6%Goal of Therapy: <7%Additional Action Suggested:  >8%   Comprehensive metabolic panel     Status: Abnormal   Collection Time: 11/20/18  8:08 AM  Result Value Ref Range   Sodium 139 135 - 145 mEq/L   Potassium 4.8 3.5 - 5.1 mEq/L   Chloride 103 96 - 112 mEq/L   CO2 29 19 - 32 mEq/L   Glucose, Bld 136 (H) 70 - 99 mg/dL   BUN 18 6 - 23 mg/dL   Creatinine, Ser 1.09 0.40 - 1.50 mg/dL   Total Bilirubin 0.5 0.2 - 1.2 mg/dL   Alkaline Phosphatase 78 39  - 117 U/L   AST 20 0 - 37 U/L   ALT 26 0 - 53 U/L   Total Protein 6.4 6.0 - 8.3 g/dL   Albumin 4.2 3.5 - 5.2 g/dL   Calcium 9.6 8.4 - 10.5 mg/dL   GFR 67.98 >60.00 mL/min  LDL cholesterol, direct     Status: None   Collection Time: 11/20/18  8:08 AM  Result Value Ref Range   Direct LDL 178.0 mg/dL    Comment: Optimal:  <100 mg/dLNear or Above Optimal:  100-129 mg/dLBorderline High:  130-159 mg/dLHigh:  160-189 mg/dLVery High:  >190 mg/dL  Urine culture     Status: None   Collection Time: 11/20/18  8:16 AM   Specimen: Urine  Result Value Ref Range   MICRO NUMBER: [id number]    SPECIMEN QUALITY: Adequate    Sample Source NOT GIVEN    STATUS: FINAL    Result: No Growth   Testosterone     Status: Abnormal   Collection Time: 11/21/18  3:52 PM  Result Value Ref Range   Testosterone 204.54 (L) 300.00 - 890.00 ng/dL    Assessment/Plan: 1. Dysuria 2. Hematuria, unspecified type Urine dip + LE, nitrites and blood. Will send for culture. Will treat empirically for cystitis with Cipro 500 mg BID. He is caucasian gentleman, smoking history > 50 with hematuria so if this continues after treatment of infection will need to proceed with CT abd/pelvis and referral to urology for cystoscopy and urine cytology. Will alter regimen based on culture results and plan for follow-up upon completion of ABX.  - POCT Urinalysis Dipstick - ciprofloxacin (CIPRO) 500 MG tablet; Take 1 tablet (500 mg total) by mouth 2 (two) times daily.  Dispense: 14 tablet; Refill: 0 - Urine Culture  3. Inguinal tenderness Mild. No palpable hernia. No noted lymphadenopathy. Rest. Tylenol for pain. Supportive undergarments. Could be related to muscle strain from recent heavy lifting. If not improving will proceed with imaging to assess for small, non-palpable herniation.    Leeanne Rio, PA-C

## 2019-02-04 ENCOUNTER — Telehealth: Payer: Self-pay | Admitting: *Deleted

## 2019-02-04 MED ORDER — SULFAMETHOXAZOLE-TRIMETHOPRIM 800-160 MG PO TABS: 1.0000 | ORAL_TABLET | Freq: Two times a day (BID) | ORAL | 0 refills | Status: AC

## 2019-02-04 NOTE — Telephone Encounter (Signed)
Patient called stating that he started taking the Cipro last night and he cannot tolerate it. He said he thought he was dying last night it made him feel so bad. He states that he had eaten, but that he is not sure that he can take this stuff if it's going to make him feel that way. He asked that I send a message to PCP because he does not want that powerful of a medicine in his system.

## 2019-02-04 NOTE — Addendum Note (Signed)
Addended by: Katina Dung on: 02/04/2019 02:59 PM   Modules accepted: Orders

## 2019-02-04 NOTE — Telephone Encounter (Signed)
Stop the Cipro. Can start Bactrim DS 1 tablet BID x 3 days for now until we get his culture results in.

## 2019-02-04 NOTE — Telephone Encounter (Signed)
Spoke with patient to give information regarding new medication.  He is aware to STOP Cipro and START Bactrim DS.  RX has been sent to pharmacy

## 2019-02-05 LAB — URINE CULTURE
MICRO NUMBER:: 1010074
SPECIMEN QUALITY:: ADEQUATE

## 2019-02-09 ENCOUNTER — Other Ambulatory Visit: Payer: Self-pay | Admitting: Emergency Medicine

## 2019-02-09 DIAGNOSIS — R319 Hematuria, unspecified: Secondary | ICD-10-CM

## 2019-02-09 DIAGNOSIS — R300 Dysuria: Secondary | ICD-10-CM

## 2019-02-09 MED ORDER — AMOXICILLIN-POT CLAVULANATE 500-125 MG PO TABS: 1.0000 | ORAL_TABLET | Freq: Two times a day (BID) | ORAL | 0 refills | Status: AC

## 2019-05-21 DIAGNOSIS — R69 Illness, unspecified: Secondary | ICD-10-CM | POA: Diagnosis not present

## 2019-06-08 ENCOUNTER — Other Ambulatory Visit: Payer: Self-pay | Admitting: Physician Assistant

## 2019-06-08 DIAGNOSIS — I10 Essential (primary) hypertension: Secondary | ICD-10-CM

## 2019-06-30 ENCOUNTER — Telehealth: Payer: Self-pay | Admitting: Physician Assistant

## 2019-06-30 NOTE — Telephone Encounter (Signed)
Pt called in stating that he is having Hernia pain, he wants to be seen in office. Ok to schedule the appt as in office?

## 2019-06-30 NOTE — Telephone Encounter (Signed)
Ok if passing screening. If pain is severe or any overlying redness, warmth of skin; or fever -- would need to be seen in ER.

## 2019-06-30 NOTE — Telephone Encounter (Signed)
Pt was aware of this.

## 2019-07-06 ENCOUNTER — Other Ambulatory Visit: Payer: Self-pay

## 2019-07-06 ENCOUNTER — Ambulatory Visit (INDEPENDENT_AMBULATORY_CARE_PROVIDER_SITE_OTHER): Payer: Medicare HMO | Admitting: Physician Assistant

## 2019-07-06 ENCOUNTER — Encounter: Payer: Self-pay | Admitting: Physician Assistant

## 2019-07-06 VITALS — BP 140/84 | HR 83 | Temp 98.7°F | Resp 16 | Ht 69.0 in | Wt 219.0 lb

## 2019-07-06 DIAGNOSIS — M5441 Lumbago with sciatica, right side: Secondary | ICD-10-CM

## 2019-07-06 DIAGNOSIS — S39011A Strain of muscle, fascia and tendon of abdomen, initial encounter: Secondary | ICD-10-CM | POA: Diagnosis not present

## 2019-07-06 MED ORDER — METHYLPREDNISOLONE 4 MG PO TBPK: ORAL_TABLET | ORAL | 0 refills | Status: DC

## 2019-07-06 MED ORDER — CYCLOBENZAPRINE HCL 10 MG PO TABS: 10.0000 mg | ORAL_TABLET | Freq: Every day | ORAL | 0 refills | Status: DC

## 2019-07-06 NOTE — Progress Notes (Signed)
Patient presents to clinic today c/o 5-6 days of R-sided low back pain with occasional radiation into RLE. Notes some tingling when this happens. Denies numbness or weakness. Denies saddle anesthesia or change to bowel/bladder habits. Notes some abdominal wall soreness as well. Notes all symptoms began after putting down laminate flooring at his house. Has history of hiatal hernia and wanted to make sure this was not cause of symptoms. Denies any heartburn or indigestion.   Past Medical History:  Diagnosis Date  . Anxiety   . History of chickenpox   . Hypertension     Current Outpatient Medications on File Prior to Visit  Medication Sig Dispense Refill  . Cholecalciferol (VITAMIN D3) 250 MCG (10000 UT) TABS Take 1 tablet by mouth daily.    Marland Kitchen lisinopril (ZESTRIL) 10 MG tablet Take 1 tablet by mouth once daily 90 tablet 0  . vitamin B-12 (CYANOCOBALAMIN) 500 MCG tablet Take 500 mcg by mouth 2 (two) times a week.      No current facility-administered medications on file prior to visit.    No Known Allergies  Family History  Problem Relation Age of Onset  . Cancer Mother   . Anxiety disorder Mother   . Diabetes Mother   . Alcohol abuse Father   . Heart disease Father   . Heart attack Father 61  . Cancer Sister   . Diabetes Brother   . Cancer Sister   . Cancer Sister     Social History   Socioeconomic History  . Marital status: Married    Spouse name: Not on file  . Number of children: Not on file  . Years of education: Not on file  . Highest education level: Not on file  Occupational History  . Not on file  Tobacco Use  . Smoking status: Never Smoker  . Smokeless tobacco: Never Used  Substance and Sexual Activity  . Alcohol use: Not Currently  . Drug use: Not Currently  . Sexual activity: Yes  Other Topics Concern  . Not on file  Social History Narrative  . Not on file   Social Determinants of Health   Financial Resource Strain:   . Difficulty of Paying  Living Expenses:   Food Insecurity:   . Worried About Charity fundraiser in the Last Year:   . Arboriculturist in the Last Year:   Transportation Needs:   . Film/video editor (Medical):   Marland Kitchen Lack of Transportation (Non-Medical):   Physical Activity:   . Days of Exercise per Week:   . Minutes of Exercise per Session:   Stress:   . Feeling of Stress :   Social Connections:   . Frequency of Communication with Friends and Family:   . Frequency of Social Gatherings with Friends and Family:   . Attends Religious Services:   . Active Member of Clubs or Organizations:   . Attends Archivist Meetings:   Marland Kitchen Marital Status:    Review of Systems - See HPI.  All other ROS are negative.  Wt 219 lb (99.3 kg)   BMI 32.34 kg/m   Physical Exam Vitals reviewed.  Constitutional:      Appearance: He is well-developed.  HENT:     Head: Normocephalic and atraumatic.  Cardiovascular:     Rate and Rhythm: Normal rate and regular rhythm.     Pulses: Normal pulses.     Heart sounds: Normal heart sounds.  Pulmonary:     Effort: Pulmonary  effort is normal.     Breath sounds: Normal breath sounds.  Abdominal:     General: There is no distension.     Palpations: Abdomen is soft.     Tenderness: There is abdominal tenderness (diffuse abdominal wall tenderness with palpation. No deeper discomfort on examination).     Comments: Chronic diastasis recti noted on examination. No herniation noted.  Musculoskeletal:     Cervical back: Neck supple.     Lumbar back: Spasms and tenderness present. No swelling or deformity. Normal range of motion. Negative right straight leg raise test and negative left straight leg raise test.  Neurological:     General: No focal deficit present.     Mental Status: He is alert.  Psychiatric:        Mood and Affect: Mood normal.    Assessment/Plan: 1. Acute right-sided low back pain with right-sided sciatica 2. Strain of abdominal wall, initial  encounter Atraumatic. Has been doing some heavy lifting. Avoid heavy lifting or overexertion. Start medrol pack. Tylenol for breakthrough pain. Flexeril at bedtime. Stretches reviewed. Follow-up if not significantly improving/resolving over the next week. ER precautions reviewed.  - methylPREDNISolone (MEDROL DOSEPAK) 4 MG TBPK tablet; Take following package directions  Dispense: 21 tablet; Refill: 0 - cyclobenzaprine (FLEXERIL) 10 MG tablet; Take 1 tablet (10 mg total) by mouth at bedtime.  Dispense: 10 tablet; Refill: 0  This visit occurred during the SARS-CoV-2 public health emergency.  Safety protocols were in place, including screening questions prior to the visit, additional usage of staff PPE, and extensive cleaning of exam room while observing appropriate contact time as indicated for disinfecting solutions.     Leeanne Rio, PA-C

## 2019-07-06 NOTE — Patient Instructions (Signed)
Please avoid heavy lifting or overexertion. Take the Flexeril at night. Take the medrol pack as directed.  Tylenol for breakthrough pain. Avoid Advil while taking the steroid.  As symptoms are easing, consider starting some of the low back stretches listed below.  Let me know if symptoms are not improving/resolving over the next week.   Sciatica Rehab Ask your health care provider which exercises are safe for you. Do exercises exactly as told by your health care provider and adjust them as directed. It is normal to feel mild stretching, pulling, tightness, or discomfort as you do these exercises. Stop right away if you feel sudden pain or your pain gets worse. Do not begin these exercises until told by your health care provider. Stretching and range-of-motion exercises These exercises warm up your muscles and joints and improve the movement and flexibility of your hips and back. These exercises also help to relieve pain, numbness, and tingling. Sciatic nerve glide 1. Sit in a chair with your head facing down toward your chest. Place your hands behind your back. Let your shoulders slump forward. 2. Slowly straighten one of your legs while you tilt your head back as if you are looking toward the ceiling. Only straighten your leg as far as you can without making your symptoms worse. 3. Hold this position for __________ seconds. 4. Slowly return to the starting position. 5. Repeat with your other leg. Repeat __________ times. Complete this exercise __________ times a day. Knee to chest with hip adduction and internal rotation  1. Lie on your back on a firm surface with both legs straight. 2. Bend one of your knees and move it up toward your chest until you feel a gentle stretch in your lower back and buttock. Then, move your knee toward the shoulder that is on the opposite side from your leg. This is hip adduction and internal rotation. ? Hold your leg in this position by holding on to the front  of your knee. 3. Hold this position for __________ seconds. 4. Slowly return to the starting position. 5. Repeat with your other leg. Repeat __________ times. Complete this exercise __________ times a day. Prone extension on elbows  1. Lie on your abdomen on a firm surface. A bed may be too soft for this exercise. 2. Prop yourself up on your elbows. 3. Use your arms to help lift your chest up until you feel a gentle stretch in your abdomen and your lower back. ? This will place some of your body weight on your elbows. If this is uncomfortable, try stacking pillows under your chest. ? Your hips should stay down, against the surface that you are lying on. Keep your hip and back muscles relaxed. 4. Hold this position for __________ seconds. 5. Slowly relax your upper body and return to the starting position. Repeat __________ times. Complete this exercise __________ times a day. Strengthening exercises These exercises build strength and endurance in your back. Endurance is the ability to use your muscles for a long time, even after they get tired. Pelvic tilt This exercise strengthens the muscles that lie deep in the abdomen. 1. Lie on your back on a firm surface. Bend your knees and keep your feet flat on the floor. 2. Tense your abdominal muscles. Tip your pelvis up toward the ceiling and flatten your lower back into the floor. ? To help with this exercise, you may place a small towel under your lower back and try to push your back into the  towel. 3. Hold this position for __________ seconds. 4. Let your muscles relax completely before you repeat this exercise. Repeat __________ times. Complete this exercise __________ times a day. Alternating arm and leg raises  1. Get on your hands and knees on a firm surface. If you are on a hard floor, you may want to use padding, such as an exercise mat, to cushion your knees. 2. Line up your arms and legs. Your hands should be directly below your  shoulders, and your knees should be directly below your hips. 3. Lift your left leg behind you. At the same time, raise your right arm and straighten it in front of you. ? Do not lift your leg higher than your hip. ? Do not lift your arm higher than your shoulder. ? Keep your abdominal and back muscles tight. ? Keep your hips facing the ground. ? Do not arch your back. ? Keep your balance carefully, and do not hold your breath. 4. Hold this position for __________ seconds. 5. Slowly return to the starting position. 6. Repeat with your right leg and your left arm. Repeat __________ times. Complete this exercise __________ times a day. Posture and body mechanics Good posture and healthy body mechanics can help to relieve stress in your body's tissues and joints. Body mechanics refers to the movements and positions of your body while you do your daily activities. Posture is part of body mechanics. Good posture means:  Your spine is in its natural S-curve position (neutral).  Your shoulders are pulled back slightly.  Your head is not tipped forward. Follow these guidelines to improve your posture and body mechanics in your everyday activities. Standing   When standing, keep your spine neutral and your feet about hip width apart. Keep a slight bend in your knees. Your ears, shoulders, and hips should line up.  When you do a task in which you stand in one place for a long time, place one foot up on a stable object that is 2-4 inches (5-10 cm) high, such as a footstool. This helps keep your spine neutral. Sitting   When sitting, keep your spine neutral and keep your feet flat on the floor. Use a footrest, if necessary, and keep your thighs parallel to the floor. Avoid rounding your shoulders, and avoid tilting your head forward.  When working at a desk or a computer, keep your desk at a height where your hands are slightly lower than your elbows. Slide your chair under your desk so you are  close enough to maintain good posture.  When working at a computer, place your monitor at a height where you are looking straight ahead and you do not have to tilt your head forward or downward to look at the screen. Resting  When lying down and resting, avoid positions that are most painful for you.  If you have pain with activities such as sitting, bending, stooping, or squatting, lie in a position in which your body does not bend very much. For example, avoid curling up on your side with your arms and knees near your chest (fetal position).  If you have pain with activities such as standing for a long time or reaching with your arms, lie with your spine in a neutral position and bend your knees slightly. Try the following positions: ? Lying on your side with a pillow between your knees. ? Lying on your back with a pillow under your knees. Lifting   When lifting objects, keep your  feet at least shoulder width apart and tighten your abdominal muscles.  Bend your knees and hips and keep your spine neutral. It is important to lift using the strength of your legs, not your back. Do not lock your knees straight out.  Always ask for help to lift heavy or awkward objects. This information is not intended to replace advice given to you by your health care provider. Make sure you discuss any questions you have with your health care provider. Document Revised: 07/25/2018 Document Reviewed: 04/24/2018 Elsevier Patient Education  [employer].

## 2019-09-17 ENCOUNTER — Encounter: Payer: Self-pay | Admitting: Physician Assistant

## 2019-09-17 ENCOUNTER — Ambulatory Visit (INDEPENDENT_AMBULATORY_CARE_PROVIDER_SITE_OTHER): Payer: Medicare HMO | Admitting: Physician Assistant

## 2019-09-17 ENCOUNTER — Other Ambulatory Visit: Payer: Self-pay

## 2019-09-17 VITALS — BP 122/70 | HR 93 | Temp 98.6°F | Resp 16 | Ht 69.0 in | Wt 217.0 lb

## 2019-09-17 DIAGNOSIS — E119 Type 2 diabetes mellitus without complications: Secondary | ICD-10-CM

## 2019-09-17 DIAGNOSIS — R1013 Epigastric pain: Secondary | ICD-10-CM | POA: Diagnosis not present

## 2019-09-17 LAB — COMPREHENSIVE METABOLIC PANEL
ALT: 33 U/L (ref 0–53)
AST: 27 U/L (ref 0–37)
Albumin: 4.5 g/dL (ref 3.5–5.2)
Alkaline Phosphatase: 83 U/L (ref 39–117)
BUN: 17 mg/dL (ref 6–23)
CO2: 24 mEq/L (ref 19–32)
Calcium: 10.1 mg/dL (ref 8.4–10.5)
Chloride: 104 mEq/L (ref 96–112)
Creatinine, Ser: 0.85 mg/dL (ref 0.40–1.50)
GFR: 90.34 mL/min (ref >60.00)
Glucose, Bld: 130 mg/dL — ABNORMAL HIGH (ref 70–99)
Potassium: 4.4 mEq/L (ref 3.5–5.1)
Sodium: 137 mEq/L (ref 135–145)
Total Bilirubin: 0.6 mg/dL (ref 0.2–1.2)
Total Protein: 7 g/dL (ref 6.0–8.3)

## 2019-09-17 LAB — CBC WITH DIFFERENTIAL/PLATELET
Basophils Absolute: 0.1 10*3/uL (ref 0.0–0.1)
Basophils Relative: 0.9 % (ref 0.0–3.0)
Eosinophils Absolute: 0.1 10*3/uL (ref 0.0–0.7)
Eosinophils Relative: 1.3 % (ref 0.0–5.0)
HCT: 44 % (ref 39.0–52.0)
Hemoglobin: 15.1 g/dL (ref 13.0–17.0)
Lymphocytes Relative: 30 % (ref 12.0–46.0)
Lymphs Abs: 2.9 10*3/uL (ref 0.7–4.0)
MCHC: 34.3 g/dL (ref 30.0–36.0)
MCV: 85.5 fl (ref 78.0–100.0)
Monocytes Absolute: 0.5 10*3/uL (ref 0.1–1.0)
Monocytes Relative: 4.9 % (ref 3.0–12.0)
Neutro Abs: 6.2 10*3/uL (ref 1.4–7.7)
Neutrophils Relative %: 62.9 % (ref 43.0–77.0)
Platelets: 311 10*3/uL (ref 150.0–400.0)
RBC: 5.15 Mil/uL (ref 4.22–5.81)
RDW: 13.7 % (ref 11.5–15.5)
WBC: 9.8 10*3/uL (ref 4.0–10.5)

## 2019-09-17 LAB — POCT URINALYSIS DIPSTICK
Bilirubin, UA: NEGATIVE
Blood, UA: NEGATIVE
Glucose, UA: NEGATIVE
Ketones, UA: NEGATIVE
Leukocytes, UA: NEGATIVE
Nitrite, UA: NEGATIVE
Protein, UA: NEGATIVE
Spec Grav, UA: >=1.03 — AB (ref 1.010–1.025)
Urobilinogen, UA: 0.2 E.U./dL
pH, UA: 6 (ref 5.0–8.0)

## 2019-09-17 LAB — H. PYLORI ANTIBODY, IGG: H Pylori IgG: NEGATIVE

## 2019-09-17 LAB — LIPASE: Lipase: 10 U/L — ABNORMAL LOW (ref 11.0–59.0)

## 2019-09-17 LAB — HEMOGLOBIN A1C: Hgb A1c MFr Bld: 7.2 % — ABNORMAL HIGH (ref 4.6–6.5)

## 2019-09-17 MED ORDER — PANTOPRAZOLE SODIUM 40 MG PO TBEC: 40.0000 mg | DELAYED_RELEASE_TABLET | Freq: Every day | ORAL | 3 refills | Status: DC

## 2019-09-17 NOTE — Progress Notes (Signed)
Patient presents to clinic today c/o 2 weeks of discomfort after eating.  Patient endorses shortly after eating he will have some epigastric pain followed later by lower abdominal cramping.  Notes this seems to happen regardless of what he eats.  Patient does have history of hiatal hernia and GERD, is not taking medication.  Does note heartburn symptoms.  Denies any dysphagia or odynophagia.  Denies shortness of breath.  Denies change to bowel or bladder habits overall except some mild urinary frequency.  Patient overdue for follow-up of diabetes.  Patient with recent diagnosis of diabetes with A1c less than 7.  Not currently on medication as he has been trying to control with diet and exercise.  Is quite overdue for follow-up.  Denies polyuria, polydipsia or polyphagia.  Some occasional urinary frequency as noted above.  Tries to stay well-hydrated.  Is not watching his diet.  Patient is taking his lisinopril as directed.  Previously declined statin.  Past Medical History:  Diagnosis Date  . Anxiety   . History of chickenpox   . Hypertension     Current Outpatient Medications on File Prior to Visit  Medication Sig Dispense Refill  . Cholecalciferol (VITAMIN D3) 250 MCG (10000 UT) TABS Take 1 tablet by mouth daily.    . cyclobenzaprine (FLEXERIL) 10 MG tablet Take 1 tablet (10 mg total) by mouth at bedtime. 10 tablet 0  . lisinopril (ZESTRIL) 10 MG tablet Take 1 tablet by mouth once daily 90 tablet 0  . vitamin B-12 (CYANOCOBALAMIN) 500 MCG tablet Take 500 mcg by mouth 2 (two) times a week.      No current facility-administered medications on file prior to visit.    No Known Allergies  Family History  Problem Relation Age of Onset  . Cancer Mother   . Anxiety disorder Mother   . Diabetes Mother   . Alcohol abuse Father   . Heart disease Father   . Heart attack Father 35  . Cancer Sister   . Diabetes Brother   . Cancer Sister   . Cancer Sister     Social History    Socioeconomic History  . Marital status: Married    Spouse name: Not on file  . Number of children: Not on file  . Years of education: Not on file  . Highest education level: Not on file  Occupational History  . Not on file  Tobacco Use  . Smoking status: Never Smoker  . Smokeless tobacco: Never Used  Substance and Sexual Activity  . Alcohol use: Not Currently  . Drug use: Not Currently  . Sexual activity: Yes  Other Topics Concern  . Not on file  Social History Narrative  . Not on file   Social Determinants of Health   Financial Resource Strain:   . Difficulty of Paying Living Expenses:   Food Insecurity:   . Worried About Charity fundraiser in the Last Year:   . Arboriculturist in the Last Year:   Transportation Needs:   . Film/video editor (Medical):   Marland Kitchen Lack of Transportation (Non-Medical):   Physical Activity:   . Days of Exercise per Week:   . Minutes of Exercise per Session:   Stress:   . Feeling of Stress :   Social Connections:   . Frequency of Communication with Friends and Family:   . Frequency of Social Gatherings with Friends and Family:   . Attends Religious Services:   . Active Member of Clubs  or Organizations:   . Attends Archivist Meetings:   Marland Kitchen Marital Status:    Review of Systems - See HPI.  All other ROS are negative.  Resp 16   Ht 5\' 9"  (1.753 m)   Wt 217 lb (98.4 kg)   BMI 32.05 kg/m   Physical Exam Vitals reviewed.  Constitutional:      Appearance: He is well-developed.  HENT:     Head: Normocephalic and atraumatic.  Eyes:     Pupils: Pupils are equal, round, and reactive to light.  Cardiovascular:     Rate and Rhythm: Normal rate and regular rhythm.     Heart sounds: Normal heart sounds.  Pulmonary:     Effort: Pulmonary effort is normal.     Breath sounds: Normal breath sounds.  Abdominal:     General: Bowel sounds are normal.     Palpations: Abdomen is soft.     Tenderness: There is abdominal tenderness  in the epigastric area and left upper quadrant.     Hernia: No hernia is present.  Neurological:     Mental Status: He is alert.  Psychiatric:        Mood and Affect: Mood normal.     Assessment/Plan: 1. Epigastric pain Suspect related to his hiatal hernia and GERD.  Will check labs today to further assess.  Start Protonix and GERD diet.  Will monitor closely.  If labs unremarkable and no significant improvement over the next couple of weeks we will have him assessed by gastroenterology. - CBC with Differential/Platelet - Comprehensive metabolic panel - Lipase - H. pylori antibody, IgG  2. New onset type 2 diabetes mellitus ([place]) Repeat labs today to assess status. - Comprehensive metabolic panel - Hemoglobin A1c  This visit occurred during the SARS-CoV-2 public health emergency.  Safety protocols were in place, including screening questions prior to the visit, additional usage of staff PPE, and extensive cleaning of exam room while observing appropriate contact time as indicated for disinfecting solutions.     Leeanne Rio, PA-C

## 2019-09-17 NOTE — Patient Instructions (Signed)
Please go to the lab today for blood work.  I will call you with your results. We will alter treatment regimen(s) if indicated by your results.   Start the Protonix once daily as directed. Follow the dietary recommendations below.  Try to avoid taking anti-inflammatories as this can worsen abdominal symptoms. Stick to tylenol if needed.   If no major improvement with what I give you we will be setting you up with GI for further evaluation. If labs abnormal we may be proceeding with imaging versus other treatments.   Hang in there!   Food Choices for Gastroesophageal Reflux Disease, Adult When you have gastroesophageal reflux disease (GERD), the foods you eat and your eating habits are very important. Choosing the right foods can help ease your discomfort. Think about working with a nutrition specialist (dietitian) to help you make good choices. What are tips for following this plan?  Meals  Choose healthy foods that are low in fat, such as fruits, vegetables, whole grains, low-fat dairy products, and lean meat, fish, and poultry.  Eat small meals often instead of 3 large meals a day. Eat your meals slowly, and in a place where you are relaxed. Avoid bending over or lying down until 2-3 hours after eating.  Avoid eating meals 2-3 hours before bed.  Avoid drinking a lot of liquid with meals.  Cook foods using methods other than frying. Bake, grill, or broil food instead.  Avoid or limit: ? Chocolate. ? Peppermint or spearmint. ? Alcohol. ? Pepper. ? Black and decaffeinated coffee. ? Black and decaffeinated tea. ? Bubbly (carbonated) soft drinks. ? Caffeinated energy drinks and soft drinks.  Limit high-fat foods such as: ? Fatty meat or fried foods. ? Whole milk, cream, butter, or ice cream. ? Nuts and nut butters. ? Pastries, donuts, and sweets made with butter or shortening.  Avoid foods that cause symptoms. These foods may be different for everyone. Common foods that  cause symptoms include: ? Tomatoes. ? Oranges, lemons, and limes. ? Peppers. ? Spicy food. ? Onions and garlic. ? Vinegar. Lifestyle  Maintain a healthy weight. Ask your doctor what weight is healthy for you. If you need to lose weight, work with your doctor to do so safely.  Exercise for at least 30 minutes for 5 or more days each week, or as told by your doctor.  Wear loose-fitting clothes.  Do not smoke. If you need help quitting, ask your doctor.  Sleep with the head of your bed higher than your feet. Use a wedge under the mattress or blocks under the bed frame to raise the head of the bed. Summary  When you have gastroesophageal reflux disease (GERD), food and lifestyle choices are very important in easing your symptoms.  Eat small meals often instead of 3 large meals a day. Eat your meals slowly, and in a place where you are relaxed.  Limit high-fat foods such as fatty meat or fried foods.  Avoid bending over or lying down until 2-3 hours after eating.  Avoid peppermint and spearmint, caffeine, alcohol, and chocolate. This information is not intended to replace advice given to you by your health care provider. Make sure you discuss any questions you have with your health care provider. Document Revised: 07/24/2018 Document Reviewed: 05/08/2016 Elsevier Patient Education  [employer].

## 2019-09-18 ENCOUNTER — Other Ambulatory Visit: Payer: Self-pay | Admitting: Physician Assistant

## 2019-09-18 ENCOUNTER — Ambulatory Visit (INDEPENDENT_AMBULATORY_CARE_PROVIDER_SITE_OTHER): Payer: Medicare HMO

## 2019-09-18 DIAGNOSIS — R7989 Other specified abnormal findings of blood chemistry: Secondary | ICD-10-CM

## 2019-09-18 DIAGNOSIS — E119 Type 2 diabetes mellitus without complications: Secondary | ICD-10-CM

## 2019-09-18 LAB — TESTOSTERONE: Testosterone: 195.97 ng/dL — ABNORMAL LOW (ref 300.00–890.00)

## 2019-10-04 ENCOUNTER — Other Ambulatory Visit: Payer: Self-pay | Admitting: Physician Assistant

## 2019-10-04 DIAGNOSIS — I10 Essential (primary) hypertension: Secondary | ICD-10-CM

## 2019-10-22 ENCOUNTER — Ambulatory Visit: Payer: Medicare HMO | Admitting: Dietician

## 2019-11-24 DIAGNOSIS — R69 Illness, unspecified: Secondary | ICD-10-CM | POA: Diagnosis not present

## 2019-12-30 ENCOUNTER — Telehealth: Payer: Self-pay | Admitting: Physician Assistant

## 2019-12-30 ENCOUNTER — Other Ambulatory Visit: Payer: Self-pay | Admitting: Physician Assistant

## 2019-12-30 DIAGNOSIS — I10 Essential (primary) hypertension: Secondary | ICD-10-CM

## 2019-12-30 DIAGNOSIS — K222 Esophageal obstruction: Secondary | ICD-10-CM

## 2019-12-30 DIAGNOSIS — Z1211 Encounter for screening for malignant neoplasm of colon: Secondary | ICD-10-CM

## 2019-12-30 DIAGNOSIS — K219 Gastro-esophageal reflux disease without esophagitis: Secondary | ICD-10-CM

## 2019-12-30 NOTE — Telephone Encounter (Signed)
Pt called in asking for a referral to [facility] center for a colonoscopy and a endoscopy. Pt states that this has been discussed in the past. Please advise

## 2019-12-30 NOTE — Telephone Encounter (Signed)
Placed referral to [facility] for colonoscopy screening

## 2020-01-20 DIAGNOSIS — R1084 Generalized abdominal pain: Secondary | ICD-10-CM | POA: Diagnosis not present

## 2020-01-20 DIAGNOSIS — Z1211 Encounter for screening for malignant neoplasm of colon: Secondary | ICD-10-CM | POA: Diagnosis not present

## 2020-01-20 DIAGNOSIS — K219 Gastro-esophageal reflux disease without esophagitis: Secondary | ICD-10-CM | POA: Diagnosis not present

## 2020-01-20 DIAGNOSIS — K439 Ventral hernia without obstruction or gangrene: Secondary | ICD-10-CM | POA: Diagnosis not present

## 2020-01-28 DIAGNOSIS — Z1211 Encounter for screening for malignant neoplasm of colon: Secondary | ICD-10-CM | POA: Diagnosis not present

## 2020-01-28 DIAGNOSIS — K222 Esophageal obstruction: Secondary | ICD-10-CM | POA: Diagnosis not present

## 2020-01-28 DIAGNOSIS — K5730 Diverticulosis of large intestine without perforation or abscess without bleeding: Secondary | ICD-10-CM | POA: Diagnosis not present

## 2020-01-28 DIAGNOSIS — K2100 Gastro-esophageal reflux disease with esophagitis, without bleeding: Secondary | ICD-10-CM | POA: Diagnosis not present

## 2020-01-28 DIAGNOSIS — K635 Polyp of colon: Secondary | ICD-10-CM | POA: Diagnosis not present

## 2020-01-28 DIAGNOSIS — K449 Diaphragmatic hernia without obstruction or gangrene: Secondary | ICD-10-CM | POA: Diagnosis not present

## 2020-01-28 DIAGNOSIS — D122 Benign neoplasm of ascending colon: Secondary | ICD-10-CM | POA: Diagnosis not present

## 2020-03-14 DIAGNOSIS — I10 Essential (primary) hypertension: Secondary | ICD-10-CM | POA: Diagnosis not present

## 2020-03-14 DIAGNOSIS — Z6833 Body mass index (BMI) 33.0-33.9, adult: Secondary | ICD-10-CM | POA: Diagnosis not present

## 2020-03-14 DIAGNOSIS — Z8249 Family history of ischemic heart disease and other diseases of the circulatory system: Secondary | ICD-10-CM | POA: Diagnosis not present

## 2020-03-14 DIAGNOSIS — Z809 Family history of malignant neoplasm, unspecified: Secondary | ICD-10-CM | POA: Diagnosis not present

## 2020-03-14 DIAGNOSIS — E669 Obesity, unspecified: Secondary | ICD-10-CM | POA: Diagnosis not present

## 2020-03-29 DIAGNOSIS — K219 Gastro-esophageal reflux disease without esophagitis: Secondary | ICD-10-CM | POA: Diagnosis not present

## 2020-03-29 DIAGNOSIS — K449 Diaphragmatic hernia without obstruction or gangrene: Secondary | ICD-10-CM | POA: Diagnosis not present

## 2020-03-29 DIAGNOSIS — K2100 Gastro-esophageal reflux disease with esophagitis, without bleeding: Secondary | ICD-10-CM | POA: Diagnosis not present

## 2020-03-29 DIAGNOSIS — K209 Esophagitis, unspecified without bleeding: Secondary | ICD-10-CM | POA: Diagnosis not present

## 2020-04-11 ENCOUNTER — Telehealth: Payer: Self-pay

## 2020-04-11 NOTE — Telephone Encounter (Signed)
Patient would like to know if he could set up an appt to meet with Selena Batten to have an x ray completed for his chiropractor.  States chiropractor would like an x ray completed of patients back and instructed patient to reach out to his PCP due to the cost being typically cheaper.    Please advise.

## 2020-04-11 NOTE — Telephone Encounter (Signed)
Left a vm message for the patient to call the office to set up an appointment with his PCP.

## 2020-04-22 DIAGNOSIS — M9902 Segmental and somatic dysfunction of thoracic region: Secondary | ICD-10-CM | POA: Diagnosis not present

## 2020-04-22 DIAGNOSIS — M5135 Other intervertebral disc degeneration, thoracolumbar region: Secondary | ICD-10-CM | POA: Diagnosis not present

## 2020-04-22 DIAGNOSIS — M9904 Segmental and somatic dysfunction of sacral region: Secondary | ICD-10-CM | POA: Diagnosis not present

## 2020-04-22 DIAGNOSIS — M5137 Other intervertebral disc degeneration, lumbosacral region: Secondary | ICD-10-CM | POA: Diagnosis not present

## 2020-04-22 DIAGNOSIS — M5136 Other intervertebral disc degeneration, lumbar region: Secondary | ICD-10-CM | POA: Diagnosis not present

## 2020-04-22 DIAGNOSIS — M9903 Segmental and somatic dysfunction of lumbar region: Secondary | ICD-10-CM | POA: Diagnosis not present

## 2020-04-25 ENCOUNTER — Telehealth: Payer: Self-pay | Admitting: Physician Assistant

## 2020-04-25 NOTE — Telephone Encounter (Signed)
Left message for patient to schedule Annual Wellness Visit.  Please schedule with Nurse Health Advisor Martha Stanley, RN at [facility]  

## 2020-04-29 DIAGNOSIS — M9904 Segmental and somatic dysfunction of sacral region: Secondary | ICD-10-CM | POA: Diagnosis not present

## 2020-04-29 DIAGNOSIS — M9903 Segmental and somatic dysfunction of lumbar region: Secondary | ICD-10-CM | POA: Diagnosis not present

## 2020-04-29 DIAGNOSIS — M5135 Other intervertebral disc degeneration, thoracolumbar region: Secondary | ICD-10-CM | POA: Diagnosis not present

## 2020-04-29 DIAGNOSIS — M5137 Other intervertebral disc degeneration, lumbosacral region: Secondary | ICD-10-CM | POA: Diagnosis not present

## 2020-04-29 DIAGNOSIS — M9902 Segmental and somatic dysfunction of thoracic region: Secondary | ICD-10-CM | POA: Diagnosis not present

## 2020-04-29 DIAGNOSIS — M5136 Other intervertebral disc degeneration, lumbar region: Secondary | ICD-10-CM | POA: Diagnosis not present

## 2020-05-17 ENCOUNTER — Telehealth: Payer: Self-pay | Admitting: Physician Assistant

## 2020-05-17 NOTE — Telephone Encounter (Signed)
Spoke to patient he had not chosen a new provider at this time and request a call back to scheduled his AWV

## 2020-06-03 DIAGNOSIS — M25551 Pain in right hip: Secondary | ICD-10-CM | POA: Diagnosis not present

## 2020-06-23 ENCOUNTER — Telehealth (INDEPENDENT_AMBULATORY_CARE_PROVIDER_SITE_OTHER): Payer: Medicare HMO | Admitting: Family Medicine

## 2020-06-23 DIAGNOSIS — R197 Diarrhea, unspecified: Secondary | ICD-10-CM

## 2020-06-23 NOTE — Progress Notes (Signed)
Virtual Visit via Video Note  I connected with David Dalton  on 06/23/20 at 11:00 AM EST by a video enabled telemedicine application and verified that I am speaking with the correct person using two identifiers.  Location patient: home, [place] Location provider:work or home office Persons participating in the virtual visit: patient, provider  I discussed the limitations of evaluation and management by telemedicine and the availability of in person appointments. The patient expressed understanding and agreed to proceed.   HPI:  Acute telemedicine visit for diarrhea: -Onset: yesterday acutely -Symptoms include: had numerous episodes of watery diarrhea yesterday, upset stomach, "gurgling" stomach at times, some sharp diffuse pains before bowl movements yesterday -most has resolved today -Denies:fevers, melena, hematochezia, inability to get out of bed/eat/drink, recent abx or travel -Has tried: imodium -Pertinent past medical history: GERD and stomach issues in the past - sees Dr. Benson Norway; reports had endoscopy and colonoscopy recently and was told everything looked okay. -Pertinent medication allergies: nkda -COVID-19 vaccine status: not vaccinated  ROS: See pertinent positives and negatives per HPI.  Past Medical History:  Diagnosis Date  . Anxiety   . History of chickenpox   . Hypertension     Past Surgical History:  Procedure Laterality Date  . FINGER SURGERY       Current Outpatient Medications:  .  Cholecalciferol (VITAMIN D3) 250 MCG (10000 UT) TABS, Take 1 tablet by mouth daily., Disp: , Rfl:  .  lisinopril (ZESTRIL) 10 MG tablet, Take 1 tablet by mouth once daily, Disp: 90 tablet, Rfl: 1 .  pantoprazole (PROTONIX) 40 MG tablet, Take 1 tablet (40 mg total) by mouth daily., Disp: 30 tablet, Rfl: 3 .  vitamin B-12 (CYANOCOBALAMIN) 500 MCG tablet, Take 500 mcg by mouth 2 (two) times a week. , Disp: , Rfl:   EXAM:  VITALS per patient if applicable:  GENERAL: alert, oriented, appears  well and in no acute distress  HEENT: atraumatic, conjunttiva clear, no obvious abnormalities on inspection of external nose and ears  NECK: normal movements of the head and neck  LUNGS: on inspection no signs of respiratory distress, breathing rate appears normal, no obvious gross SOB, gasping or wheezing  CV: no obvious cyanosis  MS: moves all visible extremities without noticeable abnormality  PSYCH/NEURO: pleasant and cooperative, no obvious depression or anxiety, speech and thought processing grossly intact  ASSESSMENT AND PLAN:  Discussed the following assessment and plan:  Diarrhea, unspecified type  -we discussed possible serious and likely etiologies, options for evaluation and workup, limitations of telemedicine visit vs in person visit, treatment, treatment risks and precautions. Pt prefers to treat via telemedicine empirically rather than in person at this moment.  Suspect viral gastroenteritis-itis most likely given the brief nature of his symptoms and that he is doing better today.  Discussed other potential etiologies as well.  He opted to try oral hydration, avoidance of dairy and red meat for a week, Imodium if needed. Work/School slipped offered: declined Advised to seek prompt in person care if worsening, new symptoms arise, or if is not improving with treatment. Discussed options for inperson care if PCP office not available. Did let this patient know that I only do telemedicine on Tuesdays and Thursdays for [place]. Advised to schedule follow up visit with PCP or [facility] if any further questions or concerns to avoid delays in care.   I discussed the assessment and treatment plan with the patient. The patient was provided an opportunity to ask questions and all were answered. The patient  agreed with the plan and demonstrated an understanding of the instructions.     Lucretia Kern, DO

## 2020-06-23 NOTE — Patient Instructions (Signed)
-  Drink plenty of liquids, water is best if you are eating.  If you are not eating, please drink fluids with electrolytes.  -Eat small healthy meals and avoid dairy and red meat.  -Can use Imodium per instructions on the box as needed if you have any further diarrhea.  I hope you are feeling all better soon!  Seek in person care promptly with your gastroenterologist or through urgent care if your symptoms worsen, new concerns arise or you are not improving with treatment.  It was nice to meet you today. I help [facility] out with telemedicine visits on Tuesdays and Thursdays and am available for visits on those days. If you have any concerns or questions following this visit please schedule a follow up visit with your Primary Care doctor or seek care at a local urgent care clinic to avoid delays in care.

## 2020-06-29 DIAGNOSIS — M25551 Pain in right hip: Secondary | ICD-10-CM | POA: Diagnosis not present

## 2020-08-08 ENCOUNTER — Encounter: Payer: Medicare HMO | Admitting: Family Medicine

## 2020-08-15 ENCOUNTER — Encounter: Payer: Self-pay | Admitting: Family Medicine

## 2020-08-15 ENCOUNTER — Other Ambulatory Visit: Payer: Self-pay

## 2020-08-15 ENCOUNTER — Other Ambulatory Visit: Payer: Medicare HMO

## 2020-08-15 ENCOUNTER — Ambulatory Visit (INDEPENDENT_AMBULATORY_CARE_PROVIDER_SITE_OTHER): Payer: Medicare HMO | Admitting: Family Medicine

## 2020-08-15 ENCOUNTER — Ambulatory Visit (INDEPENDENT_AMBULATORY_CARE_PROVIDER_SITE_OTHER): Payer: Medicare HMO

## 2020-08-15 VITALS — BP 132/82 | HR 92 | Temp 98.3°F | Resp 17 | Ht 69.0 in | Wt 215.0 lb

## 2020-08-15 DIAGNOSIS — M546 Pain in thoracic spine: Secondary | ICD-10-CM

## 2020-08-15 DIAGNOSIS — M545 Low back pain, unspecified: Secondary | ICD-10-CM | POA: Diagnosis not present

## 2020-08-15 DIAGNOSIS — K219 Gastro-esophageal reflux disease without esophagitis: Secondary | ICD-10-CM | POA: Diagnosis not present

## 2020-08-15 DIAGNOSIS — R1013 Epigastric pain: Secondary | ICD-10-CM | POA: Diagnosis not present

## 2020-08-15 DIAGNOSIS — M5441 Lumbago with sciatica, right side: Secondary | ICD-10-CM | POA: Diagnosis not present

## 2020-08-15 DIAGNOSIS — E291 Testicular hypofunction: Secondary | ICD-10-CM

## 2020-08-15 DIAGNOSIS — M5030 Other cervical disc degeneration, unspecified cervical region: Secondary | ICD-10-CM | POA: Diagnosis not present

## 2020-08-15 DIAGNOSIS — M4184 Other forms of scoliosis, thoracic region: Secondary | ICD-10-CM | POA: Diagnosis not present

## 2020-08-15 DIAGNOSIS — M5134 Other intervertebral disc degeneration, thoracic region: Secondary | ICD-10-CM | POA: Diagnosis not present

## 2020-08-15 DIAGNOSIS — E1165 Type 2 diabetes mellitus with hyperglycemia: Secondary | ICD-10-CM | POA: Diagnosis not present

## 2020-08-15 LAB — LIPID PANEL
Cholesterol: 282 mg/dL — ABNORMAL HIGH (ref 0–200)
HDL: 37.5 mg/dL — ABNORMAL LOW (ref >39.00)
Total CHOL/HDL Ratio: 8
Triglycerides: 426 mg/dL — ABNORMAL HIGH (ref 0.0–149.0)

## 2020-08-15 LAB — MICROALBUMIN / CREATININE URINE RATIO
Creatinine,U: 140.7 mg/dL
Microalb Creat Ratio: 0.9 mg/g (ref 0.0–30.0)
Microalb, Ur: 1.2 mg/dL (ref 0.0–1.9)

## 2020-08-15 LAB — CBC
HCT: 44.7 % (ref 39.0–52.0)
Hemoglobin: 15.1 g/dL (ref 13.0–17.0)
MCHC: 33.7 g/dL (ref 30.0–36.0)
MCV: 84.8 fl (ref 78.0–100.0)
Platelets: 372 10*3/uL (ref 150.0–400.0)
RBC: 5.27 Mil/uL (ref 4.22–5.81)
RDW: 14.1 % (ref 11.5–15.5)
WBC: 11.7 10*3/uL — ABNORMAL HIGH (ref 4.0–10.5)

## 2020-08-15 LAB — LIPASE: Lipase: 9 U/L — ABNORMAL LOW (ref 11.0–59.0)

## 2020-08-15 LAB — HEMOGLOBIN A1C: Hgb A1c MFr Bld: 7.8 % — ABNORMAL HIGH (ref 4.6–6.5)

## 2020-08-15 LAB — LDL CHOLESTEROL, DIRECT: Direct LDL: 157 mg/dL

## 2020-08-15 LAB — TESTOSTERONE: Testosterone: 275 ng/dL — ABNORMAL LOW (ref 300.00–890.00)

## 2020-08-15 NOTE — Patient Instructions (Addendum)
Try restarting pantoprazole once per day, and if the pain in the abdomen is not improving in next 2-3 weeks, call Dr. Benson Norway for appointment. We will follow up as well in next few weeks.   I will order some xrays. See info on back pain. Tylenol is ok for now. Follow up after xrays, but I would consider physical therapy or meeting with back specialist.   If testosterone is low, it will need to be repeated between 8 and 10am. If persistently low, would recommend urology follow up.   Call eye specialist for appointment and diabetes eye screening. Keep up the good work cutting back on sugar containing beverages.   Return to the clinic or go to the nearest emergency room if any of your symptoms worsen or new symptoms occur.   Abdominal Pain, Adult Pain in the abdomen (abdominal pain) can be caused by many things. Often, abdominal pain is not serious and it gets better with no treatment or by being treated at home. However, sometimes abdominal pain is serious. Your health care provider will ask questions about your medical history and do a physical exam to try to determine the cause of your abdominal pain. Follow these instructions at home: Medicines  Take over-the-counter and prescription medicines only as told by your health care provider.  Do not take a laxative unless told by your health care provider. General instructions  Watch your condition for any changes.  Drink enough fluid to keep your urine pale yellow.  Keep all follow-up visits as told by your health care provider. This is important.   Contact a health care provider if:  Your abdominal pain changes or gets worse.  You are not hungry or you lose weight without trying.  You are constipated or have diarrhea for more than 2-3 days.  You have pain when you urinate or have a bowel movement.  Your abdominal pain wakes you up at night.  Your pain gets worse with meals, after eating, or with certain foods.  You are vomiting and  cannot keep anything down.  You have a fever.  You have blood in your urine. Get help right away if:  Your pain does not go away as soon as your health care provider told you to expect.  You cannot stop vomiting.  Your pain is only in areas of the abdomen, such as the right side or the left lower portion of the abdomen. Pain on the right side could be caused by appendicitis.  You have bloody or black stools, or stools that look like tar.  You have severe pain, cramping, or bloating in your abdomen.  You have signs of dehydration, such as: ? Dark urine, very little urine, or no urine. ? Cracked lips. ? Dry mouth. ? Sunken eyes. ? Sleepiness. ? Weakness.  You have trouble breathing or chest pain. Summary  Often, abdominal pain is not serious and it gets better with no treatment or by being treated at home. However, sometimes abdominal pain is serious.  Watch your condition for any changes.  Take over-the-counter and prescription medicines only as told by your health care provider.  Contact a health care provider if your abdominal pain changes or gets worse.  Get help right away if you have severe pain, cramping, or bloating in your abdomen. This information is not intended to replace advice given to you by your health care provider. Make sure you discuss any questions you have with your health care provider. Document Revised: 05/22/2019 Document  Reviewed: 08/11/2018 Elsevier Patient Education  2021 [employer]   Acute Back Pain, Adult Acute back pain is sudden and usually short-lived. It is often caused by an injury to the muscles and tissues in the back. The injury may result from:  A muscle or ligament getting overstretched or torn (strained). Ligaments are tissues that connect bones to each other. Lifting something improperly can cause a back strain.  Wear and tear (degeneration) of the spinal disks. Spinal disks are circular tissue that provide cushioning between  the bones of the spine (vertebrae).  Twisting motions, such as while playing sports or doing yard work.  A hit to the back.  Arthritis. You may have a physical exam, lab tests, and imaging tests to find the cause of your pain. Acute back pain usually goes away with rest and home care. Follow these instructions at home: Managing pain, stiffness, and swelling  Treatment may include medicines for pain and inflammation that are taken by mouth or applied to the skin, prescription pain medicine, or muscle relaxants. Take over-the-counter and prescription medicines only as told by your health care provider.  Your health care provider may recommend applying ice during the first 24-48 hours after your pain starts. To do this: ? Put ice in a plastic bag. ? Place a towel between your skin and the bag. ? Leave the ice on for 20 minutes, 2-3 times a day.  If directed, apply heat to the affected area as often as told by your health care provider. Use the heat source that your health care provider recommends, such as a moist heat pack or a heating pad. ? Place a towel between your skin and the heat source. ? Leave the heat on for 20-30 minutes. ? Remove the heat if your skin turns bright red. This is especially important if you are unable to feel pain, heat, or cold. You have a greater risk of getting burned. Activity  Do not stay in bed. Staying in bed for more than 1-2 days can delay your recovery.  Sit up and stand up straight. Avoid leaning forward when you sit or hunching over when you stand. ? If you work at a desk, sit close to it so you do not need to lean over. Keep your chin tucked in. Keep your neck drawn back, and keep your elbows bent at a 90-degree angle (right angle). ? Sit high and close to the steering wheel when you drive. Add lower back (lumbar) support to your car seat, if needed.  Take short walks on even surfaces as soon as you are able. Try to increase the length of time you  walk each day.  Do not sit, drive, or stand in one place for more than 30 minutes at a time. Sitting or standing for long periods of time can put stress on your back.  Do not drive or use heavy machinery while taking prescription pain medicine.  Use proper lifting techniques. When you bend and lift, use positions that put less stress on your back: ? Bend your knees. ? Keep the load close to your body. ? Avoid twisting.  Exercise regularly as told by your health care provider. Exercising helps your back heal faster and helps prevent back injuries by keeping muscles strong and flexible.  Work with a physical therapist to make a safe exercise program, as recommended by your health care provider. Do any exercises as told by your physical therapist.   Lifestyle  Maintain a healthy weight. Extra   weight puts stress on your back and makes it difficult to have good posture.  Avoid activities or situations that make you feel anxious or stressed. Stress and anxiety increase muscle tension and can make back pain worse. Learn ways to manage anxiety and stress, such as through exercise. General instructions  Sleep on a firm mattress in a comfortable position. Try lying on your side with your knees slightly bent. If you lie on your back, put a pillow under your knees.  Follow your treatment plan as told by your health care provider. This may include: ? Cognitive or behavioral therapy. ? Acupuncture or massage therapy. ? Meditation or yoga. Contact a health care provider if:  You have pain that is not relieved with rest or medicine.  You have increasing pain going down into your legs or buttocks.  Your pain does not improve after 2 weeks.  You have pain at night.  You lose weight without trying.  You have a fever or chills. Get help right away if:  You develop new bowel or bladder control problems.  You have unusual weakness or numbness in your arms or legs.  You develop nausea or  vomiting.  You develop abdominal pain.  You feel faint. Summary  Acute back pain is sudden and usually short-lived.  Use proper lifting techniques. When you bend and lift, use positions that put less stress on your back.  Take over-the-counter and prescription medicines and apply heat or ice as directed by your health care provider. This information is not intended to replace advice given to you by your health care provider. Make sure you discuss any questions you have with your health care provider. Document Revised: 12/25/2019 Document Reviewed: 12/25/2019 Elsevier Patient Education  2021 [employer].     Type 2 Diabetes Mellitus, Self-Care, Adult Caring for yourself after you have been diagnosed with type 2 diabetes (type 2 diabetes mellitus) means keeping your blood sugar (glucose) under control with a balance of:  Nutrition.  Exercise.  Lifestyle changes.  Medicines or insulin, if needed.  Support from your team of health care providers and others. The following information explains what you need to know to manage your diabetes at home. What are the risks? Having diabetes can put you at risk for other long-term (chronic) conditions, such as heart disease and kidney disease. Your health care provider may prescribe medicines to help prevent complications from diabetes. How to monitor blood glucose  Check your blood glucose every day, as often as told by your health care provider.  Have your A1C (hemoglobin A1C) level checked two or more times a year, or as often as told by your health care provider.  Your health care provider will set personalized treatment goals for you. Generally, the goal of treatment is to maintain the following blood glucose levels: ? Before meals: 80-130 mg/dL (4.4-7.2 mmol/L). ? After meals: below 180 mg/dL (10 mmol/L). ? A1C level: less than 7%.   How to manage hyperglycemia and hypoglycemia Hyperglycemia symptoms Hyperglycemia, also called  high blood glucose, occurs when blood glucose is too high. Make sure you know the early signs of hyperglycemia, such as:  Increased thirst.  Hunger.  Feeling very tired.  Needing to urinate more often than usual.  Blurry vision. Hypoglycemia symptoms Hypoglycemia, also called low blood glucose, occurs with a blood glucose level at or below 70 mg/dL (3.9 mmol/L). Diabetes medicines lower your blood glucose and can cause hypoglycemia. The risk for hypoglycemia increases during or after exercise,  during sleep, during illness, and when skipping meals or not eating for a long time (fasting). It is important to know the symptoms of hypoglycemia and treat it right away. Always have a 15-gram rapid-acting carbohydrate snack with you to treat low blood glucose. Family members and close friends should also know the symptoms and understand how to treat hypoglycemia, in case you are not able to treat yourself. Symptoms may include:  Hunger.  Anxiety.  Sweating and feeling clammy.  Dizziness or feeling light-headed.  Sleepiness.  Increased heart rate.  Irritability.  Tingling or numbness around the mouth, lips, or tongue.  Restless sleep. Severe hypoglycemia is when your blood glucose level is at or below 54 mg/dL (3 mmol/L). Severe hypoglycemia is an emergency. Do not wait to see if the symptoms will go away. Get medical help right away. Call your local emergency services (911 in the [place] Do not drive yourself to the hospital. If you have severe hypoglycemia and you cannot eat or drink, you may need glucagon. A family member or close friend should learn how to check your blood glucose and how to give you glucagon. Ask your health care provider if you need to have an emergency glucagon kit available. Follow these instructions at home: Medicines  Take diabetes medicines as told by your health care provider. If your health care provider prescribed insulin or diabetes medicines, take them  every day.  Do not run out of insulin or other diabetes medicines. Plan ahead so you always have these available.  If you use insulin, adjust your dosage based on your physical activity and what foods you eat. Your health care provider will tell you how to adjust your dosage.  Take over-the-counter and prescription medicines only as told by your health care provider. Eating and drinking What you eat and drink affects your blood glucose and your insulin dosage. Making good choices helps to control your diabetes and prevent other health problems. A healthy meal plan includes eating lean proteins, complex carbohydrates, fresh fruits and vegetables, low-fat dairy products, and healthy fats. Make an appointment to see a registered dietitian to help you create an eating plan that is right for you. Make sure that you:  Follow instructions from your health care provider about eating or drinking restrictions.  Drink enough fluid to keep your urine pale yellow.  Keep a record of the carbohydrates that you eat. Do this by reading food labels and learning the standard serving sizes of foods.  Follow your sick-day plan whenever you cannot eat or drink as usual. Make this plan in advance with your health care provider.   Activity  Stay active. Exercise regularly, as told by your health care provider. This may include: ? Stretching and doing strength exercises, such as yoga or weight lifting, 2 or more times a week. ? Doing 150 minutes or more of moderate-intensity or vigorous-intensity exercise each week. This could be brisk walking, biking, or water aerobics.  Spread out your activity over 3 or more days of the week.  Do not go more than 2 days in a row without doing some kind of physical activity.  When you start a new exercise or activity, work with your health care provider to adjust your insulin, medicines, or food intake as needed. Lifestyle  Do not use any products that contain nicotine or  tobacco, such as cigarettes, e-cigarettes, and chewing tobacco. If you need help quitting, ask your health care provider.  If your health care provider says that   alcohol is safe for you, limit how much you use to no more than 1 drink a day for women who are not pregnant and 2 drinks a day for men. In the [place] one drink equals one 12 oz bottle of beer (355 mL), one 5 oz glass of wine (148 mL), or one 1 oz glass of hard liquor (44 mL).  Learn to manage stress. If you need help with this, ask your health care provider. Take care of your body  Keep your immunizations up to date. In addition to getting vaccinations as told by your health care provider, it is recommended that you get vaccinated against the following illnesses: ? The flu (influenza). Get a flu shot every year. ? Pneumonia. ? Hepatitis B.  Schedule an eye exam soon after your diagnosis, and then one time every year after that.  Check your skin and feet every day for cuts, bruises, redness, blisters, or sores. Schedule a foot exam with your health care provider once every year.  Brush your teeth and gums two times a day, and floss one or more times a day. Visit your dentist one or more times every 6 months.  Maintain a healthy weight.   General instructions  Share your diabetes management plan with people in your workplace, school, and household.  Carry a medical alert card or wear medical alert jewelry.  Keep all follow-up visits as told by your health care provider. This is important. Questions to ask your health care provider  Should I meet with a certified diabetes care and education specialist?  Where can I find a support group for people with diabetes? Where to find more information  American Diabetes Association (ADA): www.diabetes.org  American Association of Diabetes Care and Education Specialists (ADCES): www.diabeteseducator.org  International Diabetes Federation (IDF): www.idf.org Summary  Caring for  yourself after you have been diagnosed with type 2 diabetes (type 2 diabetes mellitus) means keeping your blood sugar (glucose) under control with a balance of nutrition, exercise, lifestyle changes, and medicine.  Check your blood glucose every day, as often as told by your health care provider.  Having diabetes can put you at risk for other long-term (chronic) conditions, such as heart disease and kidney disease. Your health care provider may prescribe medicines to help prevent complications from diabetes.  Share your diabetes management plan with people in your workplace, school, and household.  Keep all follow-up visits as told by your health care provider. This is important. This information is not intended to replace advice given to you by your health care provider. Make sure you discuss any questions you have with your health care provider. Document Revised: 05/11/2019 Document Reviewed: 05/12/2019 Elsevier Patient Education  2021 [employer]      

## 2020-08-15 NOTE — Progress Notes (Signed)
Subjective:  Patient ID: David Dalton, male    DOB: 1956-01-27  Age: 65 y.o. MRN: [id number]  CC:  Chief Complaint  Patient presents with  . Establish Care    Pt here to establish care today would like to discuss possible referral to Ortho for Back pain, or pt states can revisit for back evaluation. Lower back started about 3 months ago.     HPI David RIEGER presents for  New patient establish care.  Previous primary provider Elyn Aquas. History of diabetes, obesity, GERD with hiatal hernia, hypertension, intestinal angiodysplasia, esophageal stricture.  Gastrointestinal: Followed by Dr. Benson Norway.  History of GERD, angiodysplasia, diverticulosis, diaphragmatic hernia.  Treated with Protonix 40 mg daily prior - not taking now. Not sure if helped.   Last visit with Dr. Benson Norway in October 2021. Having epigastric pain - episodic pain past year. Off and on. Ha colonoscopy and endoscopy - no apparent cause for pain. He did rx protonix - not taking. Band pain on upper stomach. Comes and goes - there most days.  No heartburn. No fever.  Negative H pylori last year. Normal CBC, CMP (except glucose 130) in 09/2019. Lipase 10 at that time.  BM QD - solid - not hard, not straining. No n/v. No melena/hematochezia.  Large bowel movement yesterday.  No alcohol. No tobacco.  No abdominal surgeries.   Low back pain Has been treated by chiropractor, Berniece Andreas - no relief. no prior PT or back specialist. Thoracolumbar, lumbosacral disc degeneration per prior diagnoses. Low back pain for years. Worse past few months.  No fall/injury, no change in activity. R side >left.  No leg radiation/weakness.  No bowel or bladder incontinence, no saddle anesthesia, no lower extremity weakness. No hematuria, no hx of nephrolithiasis.  Tx: advil - once per week. No other meds. Occasional ice to area.  Intermediate fasting for weight loss past week. Back was better after steroid shot for hip.   Low  testosterone See prior notes, last checked in June 2021, remained low at 195.97.  Previously 204.54.  Recommended urology eval but that was declined. Prior testosterone treatment, none recently.   Diabetes: With hyperglycemia, A1c increased to 7.2 last June, referred to diabetic nutritionist, declined metformin. No home readings.  Microalbumin: due.  Optho, foot exam, pneumovax: due, declines pneumovax. Declines foot exam.   Has optho - will schedule  No regular exercise. Plans to restart.  No soda. 1/2 sweet tea - cutting back, more water. Cut out late meals and fried food.   Lab Results  Component Value Date   HGBA1C 7.2 (H) 09/17/2019   HGBA1C 6.7 (H) 11/20/2018   HGBA1C 6.7 (H) 05/14/2018   Lab Results  Component Value Date   MICROALBUR <0.7 05/19/2018   CREATININE 0.85 09/17/2019   Hypertension: Lisinopril 64m qd. No home readings.  No new side effects with meds.  BP Readings from Last 3 Encounters:  08/15/20 132/82  09/17/19 122/70  07/06/19 140/84   Lab Results  Component Value Date   CREATININE 0.85 09/17/2019     History Patient Active Problem List   Diagnosis Date Noted  . New onset type 2 diabetes mellitus ([place] 05/20/2018  . Anxiety state 05/13/2018  . Diastasis recti 05/13/2018  . Benign essential hypertension 11/16/2013  . OBESITY 07/25/2009  [facility]  . ASBESTOS EXPOSURE 08/11/2008  . ESOPHAGEAL STRICTURE 03/01/2008  . GERD 03/01/2008  . HIATAL HERNIA 03/01/2008   Past Medical History:  Diagnosis Date  . Anxiety   .  History of chickenpox   . Hypertension    Past Surgical History:  Procedure Laterality Date  . FINGER SURGERY     No Known Allergies Prior to Admission medications   Medication Sig Start Date End Date Taking? Authorizing Provider  Cholecalciferol (VITAMIN D3) 250 MCG (10000 UT) TABS Take 1 tablet by mouth daily.   Yes [provider]  lisinopril (ZESTRIL) 10 MG tablet Take 1  tablet by mouth once daily 12/31/19  Yes Brunetta Jeans, PA-C  pantoprazole (PROTONIX) 40 MG tablet Take 1 tablet (40 mg total) by mouth daily. 09/17/19  Yes Brunetta Jeans, PA-C   Social History   Socioeconomic History  . Marital status: Married    Spouse name: Not on file  . Number of children: Not on file  . Years of education: Not on file  . Highest education level: Not on file  Occupational History  . Not on file  Tobacco Use  . Smoking status: Never Smoker  . Smokeless tobacco: Never Used  Vaping Use  . Vaping Use: Never used  Substance and Sexual Activity  . Alcohol use: Not Currently  . Drug use: Not Currently  . Sexual activity: Yes  Other Topics Concern  . Not on file  Social History Narrative  . Not on file   Social Determinants of Health   Financial Resource Strain: Not on file  Food Insecurity: Not on file  Transportation Needs: Not on file  Physical Activity: Not on file  Stress: Not on file  Social Connections: Not on file  Intimate Partner Violence: Not on file    Review of Systems  Constitutional: Negative for fatigue and unexpected weight change.  Eyes: Negative for visual disturbance.  Respiratory: Negative for cough, chest tightness and shortness of breath.   Cardiovascular: Negative for chest pain, palpitations and leg swelling.  Gastrointestinal: Positive for abdominal pain. Negative for blood in stool, constipation, diarrhea, nausea and vomiting.  Genitourinary: Negative for difficulty urinating, dysuria and hematuria.  Neurological: Negative for dizziness, light-headedness and headaches.     Objective:   Vitals:   08/15/20 1034  BP: 132/82  Pulse: 92  Resp: 17  Temp: 98.3 F (36.8 C)  TempSrc: Temporal  SpO2: 95%  Weight: 215 lb (97.5 kg)  Height: 5' 9" (1.753 m)     Physical Exam Vitals reviewed.  Constitutional:      Appearance: He is well-developed.  HENT:     Head: Normocephalic and atraumatic.  Eyes:     Pupils:  Pupils are equal, round, and reactive to light.  Neck:     Vascular: No carotid bruit or JVD.  Cardiovascular:     Rate and Rhythm: Normal rate and regular rhythm.     Heart sounds: Normal heart sounds. No murmur heard.   Pulmonary:     Effort: Pulmonary effort is normal.     Breath sounds: Normal breath sounds. No rales.  Abdominal:     General: There is no distension.     Tenderness: There is abdominal tenderness (Mild tenderness palpation epigastric.  No rebound/guarding.  Negative Murphy's, negative McBurney's.  Diastasis recti but no appreciable hernia palpated.). There is no right CVA tenderness, left CVA tenderness, guarding or rebound.  Musculoskeletal:     Right lower leg: No edema.     Left lower leg: No edema.     Comments: Thoracic, lumbar spine, no midline bony tenderness.  Area of discomfort at lower paraspinals of the thoracic and upper paraspinals lumbar spine.  Skin intact, no CVA tenderness, no swelling.  Negative seated leg raise.  Able to heel and toe walk without difficulty.  Slight discomfort in area with rotation, lateral flexion but range of motion intact.  Skin:    General: Skin is warm and dry.  Neurological:     General: No focal deficit present.     Mental Status: He is alert and oriented to person, place, and time.  Psychiatric:        Mood and Affect: Mood normal.        Behavior: Behavior normal.      52 minutes spent during visit, greater than 50% counseling and assimilation of information, chart review, and discussion of plan.    Assessment & Plan:  IGNAZIO KINCAID is a 65 y.o. male . Type 2 diabetes mellitus with hyperglycemia, without long-term current use of insulin (HCC) - Plan: Hemoglobin A1c, COMPLETE METABOLIC PANEL WITH GFR, Lipid panel, Microalbumin / creatinine urine ratio  -Check A1c, microalbumin creatinine ratio.  Handout given on self-care with diabetes and recommended levels.  Would consider metformin depending on A1c but he is trying  to avoid new medications if possible.  Commended on dietary changes.  Gastroesophageal reflux disease without esophagitis Epigastric pain - Plan: COMPLETE METABOLIC PANEL WITH GFR, CBC, Lipase, POCT urinalysis dipstick  -Recurrent epigastric abdominal pain.  Repeat lipase, CMP.  Trial of PPI daily although minimal change previously.  If not improving in the next few weeks did recommend he follow-up with gastroenterology.  Differential includes radiating pain from lumbar source but less likely as palpated slight abdominal discomfort.  Consider ultrasound/imaging depending on labs.  Recheck few weeks with ER precautions given.  Acute right-sided low back pain with right-sided sciatica - Plan: DG Lumbar Spine Complete, POCT urinalysis dipstick Acute right-sided thoracic back pain - Plan: DG Thoracic Spine 2 View  -Check imaging, consider PT versus Ortho eval.  Symptomatic care discussed with handout, Tylenol over-the-counter for now.  Less likely nephrolith, check urinalysis.  Hypogonadism in male - Plan: Testosterone  -Check testosterone, need for repeat between 8 and 10 in the morning for verification of flow, then refer to urology for treatment if low.  No orders of the defined types were placed in this encounter.  Patient Instructions   Try restarting pantoprazole once per day, and if the pain in the abdomen is not improving in next 2-3 weeks, call Dr. Benson Norway for appointment. We will follow up as well in next few weeks.   I will order some xrays. See info on back pain. Tylenol is ok for now. Follow up after xrays, but I would consider physical therapy or meeting with back specialist.   If testosterone is low, it will need to be repeated between 8 and 10am. If persistently low, would recommend urology follow up.   Call eye specialist for appointment and diabetes eye screening. Keep up the good work cutting back on sugar containing beverages.   Return to the clinic or go to the nearest  emergency room if any of your symptoms worsen or new symptoms occur.   Abdominal Pain, Adult Pain in the abdomen (abdominal pain) can be caused by many things. Often, abdominal pain is not serious and it gets better with no treatment or by being treated at home. However, sometimes abdominal pain is serious. Your health care provider will ask questions about your medical history and do a physical exam to try to determine the cause of your abdominal pain. Follow these instructions at  home: Medicines  Take over-the-counter and prescription medicines only as told by your health care provider.  Do not take a laxative unless told by your health care provider. General instructions  Watch your condition for any changes.  Drink enough fluid to keep your urine pale yellow.  Keep all follow-up visits as told by your health care provider. This is important.   Contact a health care provider if:  Your abdominal pain changes or gets worse.  You are not hungry or you lose weight without trying.  You are constipated or have diarrhea for more than 2-3 days.  You have pain when you urinate or have a bowel movement.  Your abdominal pain wakes you up at night.  Your pain gets worse with meals, after eating, or with certain foods.  You are vomiting and cannot keep anything down.  You have a fever.  You have blood in your urine. Get help right away if:  Your pain does not go away as soon as your health care provider told you to expect.  You cannot stop vomiting.  Your pain is only in areas of the abdomen, such as the right side or the left lower portion of the abdomen. Pain on the right side could be caused by appendicitis.  You have bloody or black stools, or stools that look like tar.  You have severe pain, cramping, or bloating in your abdomen.  You have signs of dehydration, such as: ? Dark urine, very little urine, or no urine. ? Cracked lips. ? Dry mouth. ? Sunken  eyes. ? Sleepiness. ? Weakness.  You have trouble breathing or chest pain. Summary  Often, abdominal pain is not serious and it gets better with no treatment or by being treated at home. However, sometimes abdominal pain is serious.  Watch your condition for any changes.  Take over-the-counter and prescription medicines only as told by your health care provider.  Contact a health care provider if your abdominal pain changes or gets worse.  Get help right away if you have severe pain, cramping, or bloating in your abdomen. This information is not intended to replace advice given to you by your health care provider. Make sure you discuss any questions you have with your health care provider. Document Revised: 05/22/2019 Document Reviewed: 08/11/2018 Elsevier Patient Education  2021 [place].   Acute Back Pain, Adult Acute back pain is sudden and usually short-lived. It is often caused by an injury to the muscles and tissues in the back. The injury may result from:  A muscle or ligament getting overstretched or torn (strained). Ligaments are tissues that connect bones to each other. Lifting something improperly can cause a back strain.  Wear and tear (degeneration) of the spinal disks. Spinal disks are circular tissue that provide cushioning between the bones of the spine (vertebrae).  Twisting motions, such as while playing sports or doing yard work.  A hit to the back.  Arthritis. You may have a physical exam, lab tests, and imaging tests to find the cause of your pain. Acute back pain usually goes away with rest and home care. Follow these instructions at home: Managing pain, stiffness, and swelling  Treatment may include medicines for pain and inflammation that are taken by mouth or applied to the skin, prescription pain medicine, or muscle relaxants. Take over-the-counter and prescription medicines only as told by your health care provider.  Your health care provider may  recommend applying ice during the first 24-48 hours after your  pain starts. To do this: ? Put ice in a plastic bag. ? Place a towel between your skin and the bag. ? Leave the ice on for 20 minutes, 2-3 times a day.  If directed, apply heat to the affected area as often as told by your health care provider. Use the heat source that your health care provider recommends, such as a moist heat pack or a heating pad. ? Place a towel between your skin and the heat source. ? Leave the heat on for 20-30 minutes. ? Remove the heat if your skin turns bright red. This is especially important if you are unable to feel pain, heat, or cold. You have a greater risk of getting burned. Activity  Do not stay in bed. Staying in bed for more than 1-2 days can delay your recovery.  Sit up and stand up straight. Avoid leaning forward when you sit or hunching over when you stand. ? If you work at a desk, sit close to it so you do not need to lean over. Keep your chin tucked in. Keep your neck drawn back, and keep your elbows bent at a 90-degree angle (right angle). ? Sit high and close to the steering wheel when you drive. Add lower back (lumbar) support to your car seat, if needed.  Take short walks on even surfaces as soon as you are able. Try to increase the length of time you walk each day.  Do not sit, drive, or stand in one place for more than 30 minutes at a time. Sitting or standing for long periods of time can put stress on your back.  Do not drive or use heavy machinery while taking prescription pain medicine.  Use proper lifting techniques. When you bend and lift, use positions that put less stress on your back: ? [place] your knees. ? Keep the load close to your body. ? Avoid twisting.  Exercise regularly as told by your health care provider. Exercising helps your back heal faster and helps prevent back injuries by keeping muscles strong and flexible.  Work with a physical therapist to make a safe  exercise program, as recommended by your health care provider. Do any exercises as told by your physical therapist.   Lifestyle  Maintain a healthy weight. Extra weight puts stress on your back and makes it difficult to have good posture.  Avoid activities or situations that make you feel anxious or stressed. Stress and anxiety increase muscle tension and can make back pain worse. Learn ways to manage anxiety and stress, such as through exercise. General instructions  Sleep on a firm mattress in a comfortable position. Try lying on your side with your knees slightly bent. If you lie on your back, put a pillow under your knees.  Follow your treatment plan as told by your health care provider. This may include: ? Cognitive or behavioral therapy. ? Acupuncture or massage therapy. ? Meditation or yoga. Contact a health care provider if:  You have pain that is not relieved with rest or medicine.  You have increasing pain going down into your legs or buttocks.  Your pain does not improve after 2 weeks.  You have pain at night.  You lose weight without trying.  You have a fever or chills. Get help right away if:  You develop new bowel or bladder control problems.  You have unusual weakness or numbness in your arms or legs.  You develop nausea or vomiting.  You develop abdominal pain.  You feel faint. Summary  Acute back pain is sudden and usually short-lived.  Use proper lifting techniques. When you bend and lift, use positions that put less stress on your back.  Take over-the-counter and prescription medicines and apply heat or ice as directed by your health care provider. This information is not intended to replace advice given to you by your health care provider. Make sure you discuss any questions you have with your health care provider. Document Revised: 12/25/2019 Document Reviewed: 12/25/2019 Elsevier Patient Education  2021 [place].     Type 2 Diabetes  Mellitus, Self-Care, Adult Caring for yourself after you have been diagnosed with type 2 diabetes (type 2 diabetes mellitus) means keeping your blood sugar (glucose) under control with a balance of:  Nutrition.  Exercise.  Lifestyle changes.  Medicines or insulin, if needed.  Support from your team of health care providers and others. The following information explains what you need to know to manage your diabetes at home. What are the risks? Having diabetes can put you at risk for other long-term (chronic) conditions, such as heart disease and kidney disease. Your health care provider may prescribe medicines to help prevent complications from diabetes. How to monitor blood glucose  Check your blood glucose every day, as often as told by your health care provider.  Have your A1C (hemoglobin A1C) level checked two or more times a year, or as often as told by your health care provider.  Your health care provider will set personalized treatment goals for you. Generally, the goal of treatment is to maintain the following blood glucose levels: ? Before meals: 80-130 mg/dL (4.4-7.2 mmol/L). ? After meals: below 180 mg/dL (10 mmol/L). ? A1C level: less than 7%.   How to manage hyperglycemia and hypoglycemia Hyperglycemia symptoms Hyperglycemia, also called high blood glucose, occurs when blood glucose is too high. Make sure you know the early signs of hyperglycemia, such as:  Increased thirst.  Hunger.  Feeling very tired.  Needing to urinate more often than usual.  Blurry vision. Hypoglycemia symptoms Hypoglycemia, also called low blood glucose, occurs with a blood glucose level at or below 70 mg/dL (3.9 mmol/L). Diabetes medicines lower your blood glucose and can cause hypoglycemia. The risk for hypoglycemia increases during or after exercise, during sleep, during illness, and when skipping meals or not eating for a long time (fasting). It is important to know the symptoms of  hypoglycemia and treat it right away. Always have a 15-gram rapid-acting carbohydrate snack with you to treat low blood glucose. Family members and close friends should also know the symptoms and understand how to treat hypoglycemia, in case you are not able to treat yourself. Symptoms may include:  Hunger.  Anxiety.  Sweating and feeling clammy.  Dizziness or feeling light-headed.  Sleepiness.  Increased heart rate.  Irritability.  Tingling or numbness around the mouth, lips, or tongue.  Restless sleep. Severe hypoglycemia is when your blood glucose level is at or below 54 mg/dL (3 mmol/L). Severe hypoglycemia is an emergency. Do not wait to see if the symptoms will go away. Get medical help right away. Call your local emergency services (911 in the [place] Do not drive yourself to the hospital. If you have severe hypoglycemia and you cannot eat or drink, you may need glucagon. A family member or close friend should learn how to check your blood glucose and how to give you glucagon. Ask your health care provider if you need to have an emergency glucagon  kit available. Follow these instructions at home: Medicines  Take diabetes medicines as told by your health care provider. If your health care provider prescribed insulin or diabetes medicines, take them every day.  Do not run out of insulin or other diabetes medicines. Plan ahead so you always have these available.  If you use insulin, adjust your dosage based on your physical activity and what foods you eat. Your health care provider will tell you how to adjust your dosage.  Take over-the-counter and prescription medicines only as told by your health care provider. Eating and drinking What you eat and drink affects your blood glucose and your insulin dosage. Making good choices helps to control your diabetes and prevent other health problems. A healthy meal plan includes eating lean proteins, complex carbohydrates, fresh fruits and  vegetables, low-fat dairy products, and healthy fats. Make an appointment to see a registered dietitian to help you create an eating plan that is right for you. Make sure that you:  Follow instructions from your health care provider about eating or drinking restrictions.  Drink enough fluid to keep your urine pale yellow.  Keep a record of the carbohydrates that you eat. Do this by reading food labels and learning the standard serving sizes of foods.  Follow your sick-day plan whenever you cannot eat or drink as usual. Make this plan in advance with your health care provider.   Activity  Stay active. Exercise regularly, as told by your health care provider. This may include: ? Stretching and doing strength exercises, such as yoga or weight lifting, 2 or more times a week. ? Doing 150 minutes or more of moderate-intensity or vigorous-intensity exercise each week. This could be brisk walking, biking, or water aerobics.  Spread out your activity over 3 or more days of the week.  Do not go more than 2 days in a row without doing some kind of physical activity.  When you start a new exercise or activity, work with your health care provider to adjust your insulin, medicines, or food intake as needed. Lifestyle  Do not use any products that contain nicotine or tobacco, such as cigarettes, e-cigarettes, and chewing tobacco. If you need help quitting, ask your health care provider.  If your health care provider says that alcohol is safe for you, limit how much you use to no more than 1 drink a day for women who are not pregnant and 2 drinks a day for men. In the [place] one drink equals one 12 oz bottle of beer (355 mL), one 5 oz glass of wine (148 mL), or one 1 oz glass of hard liquor (44 mL).  Learn to manage stress. If you need help with this, ask your health care provider. Take care of your body  Keep your immunizations up to date. In addition to getting vaccinations as told by your health  care provider, it is recommended that you get vaccinated against the following illnesses: ? The flu (influenza). Get a flu shot every year. ? Pneumonia. ? Hepatitis B.  Schedule an eye exam soon after your diagnosis, and then one time every year after that.  Check your skin and feet every day for cuts, bruises, redness, blisters, or sores. Schedule a foot exam with your health care provider once every year.  Brush your teeth and gums two times a day, and floss one or more times a day. Visit your dentist one or more times every 6 months.  Maintain a healthy weight.  General instructions  Share your diabetes management plan with people in your workplace, school, and household.  Carry a medical alert card or wear medical alert jewelry.  Keep all follow-up visits as told by your health care provider. This is important. Questions to ask your health care provider  Should I meet with a certified diabetes care and education specialist?  Where can I find a support group for people with diabetes? Where to find more information  American Diabetes Association (ADA): www.diabetes.org  American Association of [facility] ([facility]): www.diabeteseducator.org  International Diabetes Federation (IDF): MemberVerification.ca Summary  Caring for yourself after you have been diagnosed with type 2 diabetes (type 2 diabetes mellitus) means keeping your blood sugar (glucose) under control with a balance of nutrition, exercise, lifestyle changes, and medicine.  Check your blood glucose every day, as often as told by your health care provider.  Having diabetes can put you at risk for other long-term (chronic) conditions, such as heart disease and kidney disease. Your health care provider may prescribe medicines to help prevent complications from diabetes.  Share your diabetes management plan with people in your workplace, school, and household.  Keep all follow-up visits as told by  your health care provider. This is important. This information is not intended to replace advice given to you by your health care provider. Make sure you discuss any questions you have with your health care provider. Document Revised: 05/11/2019 Document Reviewed: 05/12/2019 Elsevier Patient Education  2021 [place].          Signed, Merri Ray, MD [facility]

## 2020-08-16 LAB — COMPLETE METABOLIC PANEL WITH GFR
AG Ratio: 1.7 (calc) (ref 1.0–2.5)
ALT: 42 U/L (ref 9–46)
AST: 36 U/L — ABNORMAL HIGH (ref 10–35)
Albumin: 4.5 g/dL (ref 3.6–5.1)
Alkaline phosphatase (APISO): 97 U/L (ref 35–144)
BUN: 15 mg/dL (ref 7–25)
CO2: 27 mmol/L (ref 20–32)
Calcium: 10.2 mg/dL (ref 8.6–10.3)
Chloride: 103 mmol/L (ref 98–110)
Creat: 0.81 mg/dL (ref 0.70–1.25)
GFR, Est African American: 107 mL/min/{1.73_m2} (ref >=60)
GFR, Est Non African American: 93 mL/min/{1.73_m2} (ref >=60)
Globulin: 2.6 g/dL (calc) (ref 1.9–3.7)
Glucose, Bld: 137 mg/dL — ABNORMAL HIGH (ref 65–99)
Potassium: 4.9 mmol/L (ref 3.5–5.3)
Sodium: 140 mmol/L (ref 135–146)
Total Bilirubin: 0.6 mg/dL (ref 0.2–1.2)
Total Protein: 7.1 g/dL (ref 6.1–8.1)

## 2020-08-23 ENCOUNTER — Other Ambulatory Visit: Payer: Self-pay

## 2020-08-23 DIAGNOSIS — I10 Essential (primary) hypertension: Secondary | ICD-10-CM

## 2020-08-23 MED ORDER — LISINOPRIL 10 MG PO TABS: 1.0000 | ORAL_TABLET | Freq: Every day | ORAL | 0 refills | Status: DC

## 2020-09-15 ENCOUNTER — Other Ambulatory Visit: Payer: Self-pay

## 2020-09-15 ENCOUNTER — Encounter: Payer: Self-pay | Admitting: Family Medicine

## 2020-09-15 ENCOUNTER — Ambulatory Visit (INDEPENDENT_AMBULATORY_CARE_PROVIDER_SITE_OTHER): Payer: Medicare HMO | Admitting: Family Medicine

## 2020-09-15 VITALS — BP 122/74 | HR 72 | Temp 98.2°F | Resp 16 | Ht 69.0 in | Wt 214.4 lb

## 2020-09-15 DIAGNOSIS — E785 Hyperlipidemia, unspecified: Secondary | ICD-10-CM

## 2020-09-15 DIAGNOSIS — R1013 Epigastric pain: Secondary | ICD-10-CM | POA: Diagnosis not present

## 2020-09-15 DIAGNOSIS — K219 Gastro-esophageal reflux disease without esophagitis: Secondary | ICD-10-CM

## 2020-09-15 DIAGNOSIS — E1165 Type 2 diabetes mellitus with hyperglycemia: Secondary | ICD-10-CM | POA: Diagnosis not present

## 2020-09-15 MED ORDER — BLOOD GLUCOSE METER KIT: PACK | 0 refills | Status: DC

## 2020-09-15 NOTE — Patient Instructions (Addendum)
Keep a record of your blood sugars. See goals below. If they start to increase return right away. recheck A1c in 60month.  At that time if A1c is still elevated I would again recommend we start a low-dose medication.  Additionally I do recommend a statin medication not only for diabetes but because of your elevated cholesterol.  Please let me know if you change your mind  If any fever, or return of abdominal pain be seen right away.   Thank you for coming in today, please let me know if there are questions prior to next visit.    Type 2 Diabetes Mellitus, Self-Care, Adult Caring for yourself after you have been diagnosed with type 2 diabetes (type 2 diabetes mellitus) means keeping your blood sugar (glucose) under control with a balance of:  Nutrition.  Exercise.  Lifestyle changes.  Medicines or insulin, if needed.  Support from your team of health care providers and others. The following information explains what you need to know to manage your diabetes at home. What are the risks? Having diabetes can put you at risk for other long-term (chronic) conditions, such as heart disease and kidney disease. Your health care provider may prescribe medicines to help prevent complications from diabetes. How to monitor blood glucose  Check your blood glucose every day, as often as told by your health care provider.  Have your A1C (hemoglobin A1C) level checked two or more times a year, or as often as told by your health care provider.  Your health care provider will set personalized treatment goals for you. Generally, the goal of treatment is to maintain the following blood glucose levels: ? Before meals: 80-130 mg/dL (4.4-7.2 mmol/L). ? After meals: below 180 mg/dL (10 mmol/L). ? A1C level: less than 7%.   How to manage hyperglycemia and hypoglycemia Hyperglycemia symptoms Hyperglycemia, also called high blood glucose, occurs when blood glucose is too high. Make sure you know the early  signs of hyperglycemia, such as:  Increased thirst.  Hunger.  Feeling very tired.  Needing to urinate more often than usual.  Blurry vision. Hypoglycemia symptoms Hypoglycemia, also called low blood glucose, occurs with a blood glucose level at or below 70 mg/dL (3.9 mmol/L). Diabetes medicines lower your blood glucose and can cause hypoglycemia. The risk for hypoglycemia increases during or after exercise, during sleep, during illness, and when skipping meals or not eating for a long time (fasting). It is important to know the symptoms of hypoglycemia and treat it right away. Always have a 15-gram rapid-acting carbohydrate snack with you to treat low blood glucose. Family members and close friends should also know the symptoms and understand how to treat hypoglycemia, in case you are not able to treat yourself. Symptoms may include:  Hunger.  Anxiety.  Sweating and feeling clammy.  Dizziness or feeling light-headed.  Sleepiness.  Increased heart rate.  Irritability.  Tingling or numbness around the mouth, lips, or tongue.  Restless sleep. Severe hypoglycemia is when your blood glucose level is at or below 54 mg/dL (3 mmol/L). Severe hypoglycemia is an emergency. Do not wait to see if the symptoms will go away. Get medical help right away. Call your local emergency services (911 in the [place] Do not drive yourself to the hospital. If you have severe hypoglycemia and you cannot eat or drink, you may need glucagon. A family member or close friend should learn how to check your blood glucose and how to give you glucagon. Ask your health care provider if  you need to have an emergency glucagon kit available. Follow these instructions at home: Medicines  Take diabetes medicines as told by your health care provider. If your health care provider prescribed insulin or diabetes medicines, take them every day.  Do not run out of insulin or other diabetes medicines. Plan ahead so you  always have these available.  If you use insulin, adjust your dosage based on your physical activity and what foods you eat. Your health care provider will tell you how to adjust your dosage.  Take over-the-counter and prescription medicines only as told by your health care provider. Eating and drinking What you eat and drink affects your blood glucose and your insulin dosage. Making good choices helps to control your diabetes and prevent other health problems. A healthy meal plan includes eating lean proteins, complex carbohydrates, fresh fruits and vegetables, low-fat dairy products, and healthy fats. Make an appointment to see a registered dietitian to help you create an eating plan that is right for you. Make sure that you:  Follow instructions from your health care provider about eating or drinking restrictions.  Drink enough fluid to keep your urine pale yellow.  Keep a record of the carbohydrates that you eat. Do this by reading food labels and learning the standard serving sizes of foods.  Follow your sick-day plan whenever you cannot eat or drink as usual. Make this plan in advance with your health care provider.   Activity  Stay active. Exercise regularly, as told by your health care provider. This may include: ? Stretching and doing strength exercises, such as yoga or weight lifting, 2 or more times a week. ? Doing 150 minutes or more of moderate-intensity or vigorous-intensity exercise each week. This could be brisk walking, biking, or water aerobics.  Spread out your activity over 3 or more days of the week.  Do not go more than 2 days in a row without doing some kind of physical activity.  When you start a new exercise or activity, work with your health care provider to adjust your insulin, medicines, or food intake as needed. Lifestyle  Do not use any products that contain nicotine or tobacco, such as cigarettes, e-cigarettes, and chewing tobacco. If you need help  quitting, ask your health care provider.  If your health care provider says that alcohol is safe for you, limit how much you use to no more than 1 drink a day for women who are not pregnant and 2 drinks a day for men. In the [place] one drink equals one 12 oz bottle of beer (355 mL), one 5 oz glass of wine (148 mL), or one 1 oz glass of hard liquor (44 mL).  Learn to manage stress. If you need help with this, ask your health care provider. Take care of your body  Keep your immunizations up to date. In addition to getting vaccinations as told by your health care provider, it is recommended that you get vaccinated against the following illnesses: ? The flu (influenza). Get a flu shot every year. ? Pneumonia. ? Hepatitis B.  Schedule an eye exam soon after your diagnosis, and then one time every year after that.  Check your skin and feet every day for cuts, bruises, redness, blisters, or sores. Schedule a foot exam with your health care provider once every year.  Brush your teeth and gums two times a day, and floss one or more times a day. Visit your dentist one or more times every 6 months.  Maintain a healthy weight.   General instructions  Share your diabetes management plan with people in your workplace, school, and household.  Carry a medical alert card or wear medical alert jewelry.  Keep all follow-up visits as told by your health care provider. This is important. Questions to ask your health care provider  Should I meet with a certified diabetes care and education specialist?  Where can I find a support group for people with diabetes? Where to find more information  American Diabetes Association (ADA): www.diabetes.org  American Association of Diabetes Care and Education Specialists (ADCES): www.diabeteseducator.org  International Diabetes Federation (IDF): MemberVerification.ca Summary  Caring for yourself after you have been diagnosed with type 2 diabetes (type 2 diabetes  mellitus) means keeping your blood sugar (glucose) under control with a balance of nutrition, exercise, lifestyle changes, and medicine.  Check your blood glucose every day, as often as told by your health care provider.  Having diabetes can put you at risk for other long-term (chronic) conditions, such as heart disease and kidney disease. Your health care provider may prescribe medicines to help prevent complications from diabetes.  Share your diabetes management plan with people in your workplace, school, and household.  Keep all follow-up visits as told by your health care provider. This is important. This information is not intended to replace advice given to you by your health care provider. Make sure you discuss any questions you have with your health care provider. Document Revised: 05/11/2019 Document Reviewed: 05/12/2019 Elsevier Patient Education  2021 [employer].

## 2020-09-15 NOTE — Progress Notes (Signed)
Subjective:  Patient ID: David Dalton, male    DOB: 03-20-1956  Age: 65 y.o. MRN: [id number]  CC:  Chief Complaint  Patient presents with  . Diabetes    Pt has been working on a better diet and maintains he wishes to lose another 10lbs to help his health and A1c numbers, pt reports he feels better than he has in years.     HPI HARBERT FITTERER presents for   Diabetes: With hyperglycemia.  Initial diet approach.  Elevated A1c in May 2.  Has worked on better diet and plans to lose  10 pounds. Has improved diet - feeling much better. Declines meds for diabetes.  Wt Readings from Last 3 Encounters:  09/15/20 214 lb 6.4 oz (97.3 kg)  08/15/20 215 lb (97.5 kg)  09/17/19 217 lb (98.4 kg)   Microalbumin: Normal ratio last month Optho, foot exam, pneumovax: Due for ophthalmology exam, foot exam, and pneumonia vaccination.  Declines pneumonia vaccination, foot exam, planned on scheduling ophthalmology exam. - plans to schedule next week.  He is on ACE inhibitor but no statin.  Lab Results  Component Value Date   HGBA1C 7.8 (H) 08/15/2020   HGBA1C 7.2 (H) 09/17/2019   HGBA1C 6.7 (H) 11/20/2018   Lab Results  Component Value Date   MICROALBUR 1.2 08/15/2020   CREATININE 0.81 08/15/2020   GERD with epigastric abdominal pain Discussed May 2.  Repeat lipase low normal.   CMP reassuring.  Started on PPI daily, with option of GI eval if not improving.  Differential included radiation from lumbar source.  Borderline elevated AST of 36, ALT was normal. Mild leukocytosis.  No further abd pain after stopping advil. Taking PPI every other day. No fever. Declines repeat CBC today - possibly next visit.  Lab Results  Component Value Date   WBC 11.7 (H) 08/15/2020   HGB 15.1 08/15/2020   HCT 44.7 08/15/2020   MCV 84.8 08/15/2020   PLT 372.0 08/15/2020   Hyperlipidemia: Elevated on recent labs, no current statin. Discussed recommendation for diabetes and statin, and his elevated readings,  possible CAD/CVD development risk - expressed understanding and refuses statin at this time.  Lab Results  Component Value Date   CHOL 282 (H) 08/15/2020   HDL 37.50 (L) 08/15/2020   LDLDIRECT 157.0 08/15/2020   TRIG (H) 08/15/2020    426.0 Triglyceride is over 400; calculations on Lipids are invalid.   CHOLHDL 8 08/15/2020   Lab Results  Component Value Date   ALT 42 08/15/2020   AST 36 (H) 08/15/2020   ALKPHOS 83 09/17/2019   BILITOT 0.6 08/15/2020   Low back pain Discussed at May 2 visit. Of note we did discuss options of PT versus orthopedic eval for his back pain if not improving.  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 Back pain has improved since last visit - stopped ibuprofen.    History Patient Active Problem List   Diagnosis Date Noted  . New onset type 2 diabetes mellitus ([place]) 05/20/2018  . Anxiety state 05/13/2018  . Diastasis recti 05/13/2018  . Benign essential hypertension 11/16/2013  . OBESITY 07/25/2009  . [facility] 12/15/2008  . ASBESTOS EXPOSURE 08/11/2008  . ESOPHAGEAL STRICTURE 03/01/2008  . GERD 03/01/2008  . HIATAL HERNIA 03/01/2008   Past Medical History:  Diagnosis Date  . Anxiety   . History of chickenpox   . Hypertension  Past Surgical History:  Procedure Laterality Date  . FINGER SURGERY     No Known Allergies Prior to Admission medications   Medication Sig Start Date End Date Taking? Authorizing Provider  Cholecalciferol (VITAMIN D3) 250 MCG (10000 UT) TABS Take 1 tablet by mouth daily.   Yes [provider]  docusate sodium (COLACE) 100 MG capsule Take 100 mg by mouth 2 (two) times daily.   Yes [provider]  lisinopril (ZESTRIL) 10 MG tablet Take 1 tablet (10 mg total) by mouth daily. 08/23/20  Yes Wendie Agreste, MD  pantoprazole (PROTONIX) 40 MG tablet  Take 1 tablet (40 mg total) by mouth daily. 09/17/19  Yes Brunetta Jeans, PA-C   Social History   Socioeconomic History  . Marital status: Married    Spouse name: Not on file  . Number of children: Not on file  . Years of education: Not on file  . Highest education level: Not on file  Occupational History  . Not on file  Tobacco Use  . Smoking status: Never Smoker  . Smokeless tobacco: Never Used  Vaping Use  . Vaping Use: Never used  Substance and Sexual Activity  . Alcohol use: Not Currently  . Drug use: Not Currently  . Sexual activity: Yes  Other Topics Concern  . Not on file  Social History Narrative  . Not on file   Social Determinants of Health   Financial Resource Strain: Not on file  Food Insecurity: Not on file  Transportation Needs: Not on file  Physical Activity: Not on file  Stress: Not on file  Social Connections: Not on file  Intimate Partner Violence: Not on file    Review of Systems  Constitutional: Negative for fatigue, fever and unexpected weight change.  Eyes: Negative for visual disturbance.  Respiratory: Negative for cough, chest tightness and shortness of breath.   Cardiovascular: Negative for chest pain, palpitations and leg swelling.  Gastrointestinal: Negative for abdominal pain and blood in stool.  Neurological: Negative for dizziness, light-headedness and headaches.     Objective:   Vitals:   09/15/20 1321  BP: 122/74  Pulse: 72  Resp: 16  Temp: 98.2 F (36.8 C)  TempSrc: Temporal  SpO2: 96%  Weight: 214 lb 6.4 oz (97.3 kg)  Height: _0  (1.753 m)     Physical Exam Vitals reviewed.  Constitutional:      Appearance: He is well-developed.  HENT:     Head: Normocephalic and atraumatic.  Eyes:     Pupils: Pupils are equal, round, and reactive to light.  Neck:     Vascular: No carotid bruit or JVD.  Cardiovascular:     Rate and Rhythm: Normal rate and regular rhythm.     Heart sounds: Normal heart sounds. No murmur  heard.   Pulmonary:     Effort: Pulmonary effort is normal.     Breath sounds: Normal breath sounds. No rales.  Skin:    General: Skin is warm and dry.  Neurological:     Mental Status: He is alert and oriented to person, place, and time.     Assessment & Plan:  HUCKLEBERRY MARTINSON is a 65 y.o. male . Type 2 diabetes mellitus with hyperglycemia, without long-term current use of insulin (HCC) - Plan: blood glucose meter kit and supplies  Gastroesophageal reflux disease without esophagitis  Epigastric pain  Hyperlipidemia, unspecified hyperlipidemia type  Encouraged by the improvement in his back pain, abdominal pain from last visit.  Potentially  could have had some gastritis component from NSAIDs as improved off Advil.  Continue PPI, avoidance of NSAIDs if at all possible with RTC precautions if back pain or abdominal pain returns.  Discussed leukocytosis from last visit, but he is afebrile and he declines repeat CBC today.  RTC precautions given.  Hyperlipidemia discussed including statin recommendation with diabetes, medications refused at this time.  Plans to continue to work on weight loss.  Diabetes also discussed with elevated A1c, but commended on his dietary changes.  Plans to schedule ophthalmology exam.  Declines Pneumovax or foot exam.  Close monitoring of home readings, meter provided.  If increasing readings, would recommend starting metformin or other medication, otherwise recheck A1c in 2 months.  RTC precautions given.  Meds ordered this encounter  Medications  . blood glucose meter kit and supplies    Sig: Dispense based on patient and insurance preference. Use once per day.    Dispense:  1 each    Refill:  0    Order Specific Question:   Number of strips    Answer:   100    Order Specific Question:   Number of lancets    Answer:   100   Patient Instructions   Keep a record of your blood sugars. See goals below. If they start to increase return right away. recheck  A1c in 5month.  At that time if A1c is still elevated I would again recommend we start a low-dose medication.  Additionally I do recommend a statin medication not only for diabetes but because of your elevated cholesterol.  Please let me know if you change your mind  If any fever, or return of abdominal pain be seen right away.   Thank you for coming in today, please let me know if there are questions prior to next visit.    Type 2 Diabetes Mellitus, Self-Care, Adult Caring for yourself after you have been diagnosed with type 2 diabetes (type 2 diabetes mellitus) means keeping your blood sugar (glucose) under control with a balance of:  Nutrition.  Exercise.  Lifestyle changes.  Medicines or insulin, if needed.  Support from your team of health care providers and others. The following information explains what you need to know to manage your diabetes at home. What are the risks? Having diabetes can put you at risk for other long-term (chronic) conditions, such as heart disease and kidney disease. Your health care provider may prescribe medicines to help prevent complications from diabetes. How to monitor blood glucose  Check your blood glucose every day, as often as told by your health care provider.  Have your A1C (hemoglobin A1C) level checked two or more times a year, or as often as told by your health care provider.  Your health care provider will set personalized treatment goals for you. Generally, the goal of treatment is to maintain the following blood glucose levels: ? Before meals: 80-130 mg/dL (4.4-7.2 mmol/L). ? After meals: below 180 mg/dL (10 mmol/L). ? A1C level: less than 7%.   How to manage hyperglycemia and hypoglycemia Hyperglycemia symptoms Hyperglycemia, also called high blood glucose, occurs when blood glucose is too high. Make sure you know the early signs of hyperglycemia, such as:  Increased thirst.  Hunger.  Feeling very tired.  Needing to urinate  more often than usual.  Blurry vision. Hypoglycemia symptoms Hypoglycemia, also called low blood glucose, occurs with a blood glucose level at or below 70 mg/dL (3.9 mmol/L). Diabetes medicines lower your blood glucose  and can cause hypoglycemia. The risk for hypoglycemia increases during or after exercise, during sleep, during illness, and when skipping meals or not eating for a long time (fasting). It is important to know the symptoms of hypoglycemia and treat it right away. Always have a 15-gram rapid-acting carbohydrate snack with you to treat low blood glucose. Family members and close friends should also know the symptoms and understand how to treat hypoglycemia, in case you are not able to treat yourself. Symptoms may include:  Hunger.  Anxiety.  Sweating and feeling clammy.  Dizziness or feeling light-headed.  Sleepiness.  Increased heart rate.  Irritability.  Tingling or numbness around the mouth, lips, or tongue.  Restless sleep. Severe hypoglycemia is when your blood glucose level is at or below 54 mg/dL (3 mmol/L). Severe hypoglycemia is an emergency. Do not wait to see if the symptoms will go away. Get medical help right away. Call your local emergency services (911 in the [place] Do not drive yourself to the hospital. If you have severe hypoglycemia and you cannot eat or drink, you may need glucagon. A family member or close friend should learn how to check your blood glucose and how to give you glucagon. Ask your health care provider if you need to have an emergency glucagon kit available. Follow these instructions at home: Medicines  Take diabetes medicines as told by your health care provider. If your health care provider prescribed insulin or diabetes medicines, take them every day.  Do not run out of insulin or other diabetes medicines. Plan ahead so you always have these available.  If you use insulin, adjust your dosage based on your physical activity and what  foods you eat. Your health care provider will tell you how to adjust your dosage.  Take over-the-counter and prescription medicines only as told by your health care provider. Eating and drinking What you eat and drink affects your blood glucose and your insulin dosage. Making good choices helps to control your diabetes and prevent other health problems. A healthy meal plan includes eating lean proteins, complex carbohydrates, fresh fruits and vegetables, low-fat dairy products, and healthy fats. Make an appointment to see a registered dietitian to help you create an eating plan that is right for you. Make sure that you:  Follow instructions from your health care provider about eating or drinking restrictions.  Drink enough fluid to keep your urine pale yellow.  Keep a record of the carbohydrates that you eat. Do this by reading food labels and learning the standard serving sizes of foods.  Follow your sick-day plan whenever you cannot eat or drink as usual. Make this plan in advance with your health care provider.   Activity  Stay active. Exercise regularly, as told by your health care provider. This may include: ? Stretching and doing strength exercises, such as yoga or weight lifting, 2 or more times a week. ? Doing 150 minutes or more of moderate-intensity or vigorous-intensity exercise each week. This could be brisk walking, biking, or water aerobics.  Spread out your activity over 3 or more days of the week.  Do not go more than 2 days in a row without doing some kind of physical activity.  When you start a new exercise or activity, work with your health care provider to adjust your insulin, medicines, or food intake as needed. Lifestyle  Do not use any products that contain nicotine or tobacco, such as cigarettes, e-cigarettes, and chewing tobacco. If you need help quitting, ask  your health care provider.  If your health care provider says that alcohol is safe for you, limit how  much you use to no more than 1 drink a day for women who are not pregnant and 2 drinks a day for men. In the [place] one drink equals one 12 oz bottle of beer (355 mL), one 5 oz glass of wine (148 mL), or one 1 oz glass of hard liquor (44 mL).  Learn to manage stress. If you need help with this, ask your health care provider. Take care of your body  Keep your immunizations up to date. In addition to getting vaccinations as told by your health care provider, it is recommended that you get vaccinated against the following illnesses: ? The flu (influenza). Get a flu shot every year. ? Pneumonia. ? Hepatitis B.  Schedule an eye exam soon after your diagnosis, and then one time every year after that.  Check your skin and feet every day for cuts, bruises, redness, blisters, or sores. Schedule a foot exam with your health care provider once every year.  Brush your teeth and gums two times a day, and floss one or more times a day. Visit your dentist one or more times every 6 months.  Maintain a healthy weight.   General instructions  Share your diabetes management plan with people in your workplace, school, and household.  Carry a medical alert card or wear medical alert jewelry.  Keep all follow-up visits as told by your health care provider. This is important. Questions to ask your health care provider  Should I meet with a certified diabetes care and education specialist?  Where can I find a support group for people with diabetes? Where to find more information  American Diabetes Association (ADA): www.diabetes.org  American Association of Diabetes Care and Education Specialists (ADCES): www.diabeteseducator.org  International Diabetes Federation (IDF): MemberVerification.ca Summary  Caring for yourself after you have been diagnosed with type 2 diabetes (type 2 diabetes mellitus) means keeping your blood sugar (glucose) under control with a balance of nutrition, exercise, lifestyle changes, and  medicine.  Check your blood glucose every day, as often as told by your health care provider.  Having diabetes can put you at risk for other long-term (chronic) conditions, such as heart disease and kidney disease. Your health care provider may prescribe medicines to help prevent complications from diabetes.  Share your diabetes management plan with people in your workplace, school, and household.  Keep all follow-up visits as told by your health care provider. This is important. This information is not intended to replace advice given to you by your health care provider. Make sure you discuss any questions you have with your health care provider. Document Revised: 05/11/2019 Document Reviewed: 05/12/2019 Elsevier Patient Education  2021 [place].      Signed, Merri Ray, MD [facility]

## 2020-09-16 ENCOUNTER — Telehealth: Payer: Self-pay

## 2020-09-16 DIAGNOSIS — E1165 Type 2 diabetes mellitus with hyperglycemia: Secondary | ICD-10-CM

## 2020-09-16 NOTE — Telephone Encounter (Signed)
Pharmacy sent fax asking for separate Rx be sent for test strips, and lancets with directions as well as the diagnosis code and quantity.   Please order to [facility] on file

## 2020-09-20 MED ORDER — GLUCOSE BLOOD VI STRP: ORAL_STRIP | 3 refills | Status: DC

## 2020-09-20 MED ORDER — FREESTYLE LANCETS MISC: 3 refills | Status: DC

## 2020-11-17 ENCOUNTER — Encounter: Payer: Self-pay | Admitting: Family Medicine

## 2020-11-17 ENCOUNTER — Ambulatory Visit (INDEPENDENT_AMBULATORY_CARE_PROVIDER_SITE_OTHER): Payer: Medicare HMO | Admitting: Family Medicine

## 2020-11-17 ENCOUNTER — Other Ambulatory Visit: Payer: Self-pay

## 2020-11-17 VITALS — BP 132/76 | HR 74 | Temp 98.3°F | Resp 17 | Ht 69.0 in | Wt 213.6 lb

## 2020-11-17 DIAGNOSIS — I10 Essential (primary) hypertension: Secondary | ICD-10-CM | POA: Diagnosis not present

## 2020-11-17 DIAGNOSIS — E1165 Type 2 diabetes mellitus with hyperglycemia: Secondary | ICD-10-CM | POA: Diagnosis not present

## 2020-11-17 DIAGNOSIS — E1169 Type 2 diabetes mellitus with other specified complication: Secondary | ICD-10-CM | POA: Diagnosis not present

## 2020-11-17 DIAGNOSIS — E785 Hyperlipidemia, unspecified: Secondary | ICD-10-CM

## 2020-11-17 NOTE — Patient Instructions (Signed)
Keep up the good work with diet! I will check a 36-monthblood sugar test today to decide if any medications are recommended.  Follow-up in 3 months and we can recheck your cholesterol at that time.  However, as we discussed there is a recommendation to be on a statin for all patients with diabetes, and with your previous readings would likely recommend a high-dose statin.  Initially could try low-dose to make sure you tolerate that medication.  Let me know if you change your mind about starting a statin medication as we can start one at any time but again we will recheck your labs in 3 months.  Take care.

## 2020-11-17 NOTE — Progress Notes (Signed)
Subjective:  Patient ID: David Dalton, male    DOB: 01/12/1956  Age: 65 y.o. MRN: [id number]  CC:  Chief Complaint  Patient presents with   Gastroesophageal Reflux    No action required, pt reports started eating an apple before bed and has since not had issues with his reflux, no concerns    Diabetes    Pt reports no concerns doing well, has not taken BG at home, pt reports has not had time     HPI David Dalton presents for  GERD/epigastric pain Discussed at previous visits.  Improved at June 2 visit.  Had stopped NSAIDs, possible gastritis component.  Was continued on PPI and avoidance of NSAIDs.  Eats 1/2 apple before bed and has not had issues with reflux recently. Better off nsaids. No need for PPI recently.   Hypertension: Lisinopril 10 mg qd. No CP, dyspnea or med side effects.  Home readings: 130/70 BP Readings from Last 3 Encounters:  11/17/20 132/76  09/15/20 122/74  08/15/20 132/82   Lab Results  Component Value Date   CREATININE 0.81 08/15/2020   Diabetes: Type II with hyperglycemia,  Initial diet approach. Weight improving at his June 2 visit.  Goal weight loss of 10 pounds, had lost approximately 3 pounds at that time. Down further with intermittent fasting, cut out carbs  Has tester, but not checking home readings.  Has not had a chance to schedule optho exam.  On ACE -I, no statin.   Wt Readings from Last 3 Encounters:  11/17/20 213 lb 9.6 oz (96.9 kg)  09/15/20 214 lb 6.4 oz (97.3 kg)  08/15/20 215 lb (97.5 kg)   Microalbumin: Normal ratio in May. Optho, foot exam, pneumovax: Has declined Pneumovax, foot exam.  Plans to schedule ophthalmology exam.  Lab Results  Component Value Date   HGBA1C 7.8 (H) 08/15/2020   HGBA1C 7.2 (H) 09/17/2019   HGBA1C 6.7 (H) 11/20/2018   Lab Results  Component Value Date   MICROALBUR 1.2 08/15/2020   CREATININE 0.81 08/15/2020   Hyperlipidemia: Does not want to take statin. Discussed statin recommendation with  diabetes, cardiovascular disease risk, and high intensity with his calculated ascvd risk score. Still declines statin at this time. All questions were answered.  Lab Results  Component Value Date   CHOL 282 (H) 08/15/2020   HDL 37.50 (L) 08/15/2020   LDLDIRECT 157.0 08/15/2020   TRIG (H) 08/15/2020    426.0 Triglyceride is over 400; calculations on Lipids are invalid.   CHOLHDL 8 08/15/2020   Lab Results  Component Value Date   ALT 42 08/15/2020   AST 36 (H) 08/15/2020   ALKPHOS 83 09/17/2019   BILITOT 0.6 08/15/2020  The 10-year ASCVD risk score Mikey Bussing DC Jr., et al., 2013) is: 51.6%   Values used to calculate the score:     Age: 19 years     Sex: Male     Is Non-Hispanic African American: No     Diabetic: Yes     Tobacco smoker: Yes     Systolic Blood Pressure: 270 mmHg     Is BP treated: Yes     HDL Cholesterol: 37.5 mg/dL     Total Cholesterol: 282 mg/dL     History Patient Active Problem List   Diagnosis Date Noted   New onset type 2 diabetes mellitus ([place]) 05/20/2018   Anxiety state 05/13/2018   Diastasis recti 05/13/2018   Benign essential hypertension 11/16/2013   OBESITY 07/25/2009   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 Authorizing Provider  blood glucose meter kit and supplies Dispense based on patient and insurance preference. Use once per day. 09/15/20  Yes Wendie Agreste, MD  Cholecalciferol (VITAMIN D3) 250 MCG (10000 UT) TABS Take 1 tablet by mouth daily.   Yes [provider]  docusate sodium (COLACE) 100 MG capsule Take 100 mg by mouth 2 (two) times daily.   Yes  [provider]  glucose blood test strip Use as instructed once per day 09/20/20  Yes Wendie Agreste, MD  Lancets (FREESTYLE) lancets Use as instructed once per day. 09/20/20  Yes Wendie Agreste, MD  lisinopril (ZESTRIL) 10 MG tablet Take 1 tablet (10 mg total) by mouth daily. 08/23/20  Yes Wendie Agreste, MD  pantoprazole (PROTONIX) 40 MG tablet Take 1 tablet (40 mg total) by mouth daily. 09/17/19  Yes Brunetta Jeans, PA-C   Social History   Socioeconomic History   Marital status: Married    Spouse name: Not on file   Number of children: Not on file   Years of education: Not on file   Highest education level: Not on file  Occupational History   Not on file  Tobacco Use   Smoking status: Never   Smokeless tobacco: Never  Vaping Use   Vaping Use: Never used  Substance and Sexual Activity   Alcohol use: Not Currently   Drug use: Not Currently   Sexual activity: Yes  Other Topics Concern   Not on file  Social History Narrative   Not on file   Social Determinants of Health   Financial Resource Strain: Not on file  Food Insecurity: Not on file  Transportation Needs: Not on file  Physical Activity: Not on file  Stress: Not on file  Social Connections: Not on file  Intimate Partner Violence: Not on file    Review of Systems  Constitutional:  Negative for fatigue and unexpected weight change.  Eyes:  Negative for visual disturbance.  Respiratory:  Negative for cough, chest tightness and shortness of breath.   Cardiovascular:  Negative for chest pain, palpitations and leg swelling.  Gastrointestinal:  Negative for abdominal pain and blood in stool.  Neurological:  Negative for dizziness, light-headedness and headaches.    Objective:   Vitals:   11/17/20 1027  BP: 132/76  Pulse: 74  Resp: 17  Temp: 98.3 F (36.8 C)  TempSrc: Temporal  SpO2: 97%  Weight: 213 lb 9.6 oz (96.9 kg)  Height: 5' 9"  (1.753 m)     Physical Exam Vitals reviewed.   Constitutional:      Appearance: He is well-developed.  HENT:     Head: Normocephalic and atraumatic.  Neck:     Vascular: No carotid bruit or JVD.  Cardiovascular:     Rate and Rhythm: Normal rate and regular rhythm.     Heart sounds: Normal heart sounds. No murmur heard. Pulmonary:     Effort: Pulmonary effort is normal.     Breath sounds: Normal breath sounds. No rales.  Musculoskeletal:     Right lower  leg: No edema.     Left lower leg: No edema.  Skin:    General: Skin is warm and dry.  Neurological:     Mental Status: He is alert and oriented to person, place, and time.  Psychiatric:        Mood and Affect: Mood normal.       Assessment & Plan:  David Dalton is a 65 y.o. male . Type 2 diabetes mellitus with hyperglycemia, without long-term current use of insulin (HCC) - Plan: POCT glycosylated hemoglobin (Hb A1C)  -Commended on diet improvements, check A1c to determine med need.   Hyperlipidemia associated with type 2 diabetes mellitus ([place])  -Discussed his ASCVD risk score, and with diabetes would recommend high potency statin.  He declines statin at this time, and understands recommendations.  Benign essential hypertension  Stable with lisinopril, continue same.  No orders of the defined types were placed in this encounter.  Patient Instructions  Keep up the good work with diet! I will check a 55-monthblood sugar test today to decide if any medications are recommended.  Follow-up in 3 months and we can recheck your cholesterol at that time.  However, as we discussed there is a recommendation to be on a statin for all patients with diabetes, and with your previous readings would likely recommend a high-dose statin.  Initially could try low-dose to make sure you tolerate that medication.  Let me know if you change your mind about starting a statin medication as we can start one at any time but again we will recheck your labs in 3 months.  Take care.    Signed,    JMerri Ray MD [facility] 11/17/20 11:46 AM

## 2020-11-21 ENCOUNTER — Telehealth: Payer: Self-pay | Admitting: Family Medicine

## 2020-11-21 NOTE — Telephone Encounter (Signed)
Left message for patient to call back and schedule Medicare Annual Wellness Visit (AWV).   Please offer to do virtually or by telephone. I can be reached directly at [phone].  Due for AWVI  Please schedule at anytime with Nurse Health Advisor.

## 2020-11-23 ENCOUNTER — Other Ambulatory Visit: Payer: Self-pay | Admitting: Family Medicine

## 2020-11-23 DIAGNOSIS — I10 Essential (primary) hypertension: Secondary | ICD-10-CM

## 2020-11-28 ENCOUNTER — Telehealth: Payer: Self-pay | Admitting: Family Medicine

## 2020-11-28 NOTE — Chronic Care Management (AMB) (Signed)
  Chronic Care Management   Outreach Note  11/28/2020 Name: PHILIPPE POSTON MRN: [id number] DOB: Jan 20, 1956  Referred by: Wendie Agreste, MD Reason for referral : No chief complaint on file.   An unsuccessful telephone outreach was attempted today. The patient was referred to the pharmacist for assistance with care management and care coordination.   Follow Up Plan:   Tatjana Dellinger Upstream Scheduler

## 2020-11-29 ENCOUNTER — Telehealth: Payer: Self-pay | Admitting: Family Medicine

## 2020-11-29 NOTE — Progress Notes (Signed)
  Chronic Care Management   Note  11/29/2020 Name: LEWARD DEMERS MRN: [id number] DOB: [birth year]  GIOVONI HEFFERON is a 65 y.o. year old male who is a primary care patient of Carlota Raspberry Ranell Patrick, MD. I reached out to [name] by phone today in response to a referral sent by Mr. An Reeds Keetch's PCP, Wendie Agreste, MD.   Mr. Mcrorie was given information about Chronic Care Management services today including:  CCM service includes personalized support from designated clinical staff supervised by his physician, including individualized plan of care and coordination with other care providers 24/7 contact phone numbers for assistance for urgent and routine care needs. Service will only be billed when office clinical staff spend 20 minutes or more in a month to coordinate care. Only one practitioner may furnish and bill the service in a calendar month. The patient may stop CCM services at any time (effective at the end of the month) by phone call to the office staff.   Patient agreed to services and verbal consent obtained.   Follow up plan:   Tatjana Secretary/administrator

## 2020-12-05 ENCOUNTER — Ambulatory Visit: Payer: Medicare HMO

## 2020-12-08 ENCOUNTER — Ambulatory Visit: Payer: Medicare HMO

## 2020-12-12 ENCOUNTER — Ambulatory Visit: Payer: Medicare HMO

## 2020-12-22 ENCOUNTER — Telehealth: Payer: Self-pay

## 2020-12-22 NOTE — Progress Notes (Signed)
    Chronic Care Management Pharmacy Assistant   Name: David Dalton  MRN: [id number] DOB: 1955/12/25   David Dalton is an 65 y.o. year old male who presents for his initial CCM visit with the clinical pharmacist.   Reason for Encounter: Chart Prep / Initial CCM visit    Recent office visits:  11/17/20 Merri Ray, MD (PCP) - Family Medicine - Type 2 Diabetes - Labs were ordered. Statin recommended, patient declined. Follow up in 3 months.   09/15/20 Merri Ray, MD (PCP) - Family Medicine - Type 2 Diabetes -  Discontinued Ibuprofen. No other medication changes.   08/15/20 - Merri Ray, MD (PCP) - Family Medicine - Type 2 Diabetes - Labs were ordered. Xray of Lumbar spine and Thoracic spine were ordered. Try restarting pantoprazole once per day, and if the pain in the abdomen is not improving in next 2-3 weeks, call Dr. Benson Norway for appointment. We will follow up as well in next few weeks.  Recent consult visits:  06/29/20 Suella Broad, MD - Pain Medicine - No notes available.   Hospital visits:  None in previous 6 months  Medications: Outpatient Encounter Medications as of 12/22/2020  Medication Sig   blood glucose meter kit and supplies Dispense based on patient and insurance preference. Use once per day.   Cholecalciferol (VITAMIN D3) 250 MCG (10000 UT) TABS Take 1 tablet by mouth daily.   docusate sodium (COLACE) 100 MG capsule Take 100 mg by mouth 2 (two) times daily.   glucose blood test strip Use as instructed once per day   Lancets (FREESTYLE) lancets Use as instructed once per day.   lisinopril (ZESTRIL) 10 MG tablet Take 1 tablet by mouth once daily   pantoprazole (PROTONIX) 40 MG tablet Take 1 tablet (40 mg total) by mouth daily.   No facility-administered encounter medications on file as of 12/22/2020.    Have you seen any other providers since your last visit? No  Any changes in your medications or health? No  Any side effects from any medications? No  Do you  have an symptoms or problems not managed by your medications? No  Any concerns about your health right now? No  Has your provider asked that you check blood pressure, blood sugar, or follow special diet at home? No, however patient does check his blood pressures at home.  Do you get any type of exercise on a regular basis? Yes  Can you think of a goal you would like to reach for your health? Patient would like to be young again. He would like to lose about 10 pounds.   Do you have any problems getting your medications? No  Is there anything that you would like to discuss during the appointment? No. Patient did not have anything he could think to add currently.    Please bring medications and supplements to appointment  Patient confirmed appointment date and time.     Care Gaps  AWV: done 11/21/20 Colonoscopy: due 06/30/30 DM Eye Exam: declined DEXA: N/A Mammogram: N/A     Star Rating Drugs: Lisinopril (ZESTRIL) 10 MG tablet - last filled 11/23/20 90 days    Future Appointments  Date Time Provider [place]  12/29/2020  8:30 AM [facility] HEALTH COACH [facility]  01/04/2021 11:00 AM [facility] CCM PHARMACIST [facility] [place], [facility] Clinical Pharmacist Assistant  [phone]  Time Spent: 40 minutes

## 2020-12-27 LAB — HM DIABETES EYE EXAM

## 2020-12-29 ENCOUNTER — Ambulatory Visit (INDEPENDENT_AMBULATORY_CARE_PROVIDER_SITE_OTHER): Payer: Medicare HMO | Admitting: *Deleted

## 2020-12-29 DIAGNOSIS — Z0000 Encounter for general adult medical examination without abnormal findings: Secondary | ICD-10-CM | POA: Diagnosis not present

## 2020-12-29 NOTE — Patient Instructions (Signed)
David Dalton , Thank you for taking time to come for your Medicare Wellness Visit. I appreciate your ongoing commitment to your health goals. Please review the following plan we discussed and let me know if I can assist you in the future.   Screening recommendations/referrals: Colonoscopy: up to date Recommended yearly ophthalmology/optometry visit for glaucoma screening and checkup Recommended yearly dental visit for hygiene and checkup  Vaccinations: Influenza vaccine: Education provided Pneumococcal vaccine: Education provided Tdap vaccine: up to date Shingles vaccine: Education provided    Advanced directives: Education provided  Conditions/risks identified:    Preventive Care 8 Years and Older, Male Preventive care refers to lifestyle choices and visits with your health care provider that can promote health and wellness. What does preventive care include? A yearly physical exam. This is also called an annual well check. Dental exams once or twice a year. Routine eye exams. Ask your health care provider how often you should have your eyes checked. Personal lifestyle choices, including: Daily care of your teeth and gums. Regular physical activity. Eating a healthy diet. Avoiding tobacco and drug use. Limiting alcohol use. Practicing safe sex. Taking low doses of aspirin every day. Taking vitamin and mineral supplements as recommended by your health care provider. What happens during an annual well check? The services and screenings done by your health care provider during your annual well check will depend on your age, overall health, lifestyle risk factors, and family history of disease. Counseling  Your health care provider may ask you questions about your: Alcohol use. Tobacco use. Drug use. Emotional well-being. Home and relationship well-being. Sexual activity. Eating habits. History of falls. Memory and ability to understand (cognition). Work and work  Statistician. Screening  You may have the following tests or measurements: Height, weight, and BMI. Blood pressure. Lipid and cholesterol levels. These may be checked every 5 years, or more frequently if you are over 88 years old. Skin check. Lung cancer screening. You may have this screening every year starting at age 17 if you have a 30-pack-year history of smoking and currently smoke or have quit within the past 15 years. Fecal occult blood test (FOBT) of the stool. You may have this test every year starting at age 62. Flexible sigmoidoscopy or colonoscopy. You may have a sigmoidoscopy every 5 years or a colonoscopy every 10 years starting at age 75. Prostate cancer screening. Recommendations will vary depending on your family history and other risks. Hepatitis C blood test. Hepatitis B blood test. Sexually transmitted disease (STD) testing. Diabetes screening. This is done by checking your blood sugar (glucose) after you have not eaten for a while (fasting). You may have this done every 1-3 years. Abdominal aortic aneurysm (AAA) screening. You may need this if you are a current or former smoker. Osteoporosis. You may be screened starting at age 43 if you are at high risk. Talk with your health care provider about your test results, treatment options, and if necessary, the need for more tests. Vaccines  Your health care provider may recommend certain vaccines, such as: Influenza vaccine. This is recommended every year. Tetanus, diphtheria, and acellular pertussis (Tdap, Td) vaccine. You may need a Td booster every 10 years. Zoster vaccine. You may need this after age 2. Pneumococcal 13-valent conjugate (PCV13) vaccine. One dose is recommended after age 65. Pneumococcal polysaccharide (PPSV23) vaccine. One dose is recommended after age 66. Talk to your health care provider about which screenings and vaccines you need and how often you need them.  This information is not intended to replace  advice given to you by your health care provider. Make sure you discuss any questions you have with your health care provider. Document Released: 04/29/2015 Document Revised: 12/21/2015 Document Reviewed: 02/01/2015 Elsevier Interactive Patient Education  2017 [employer] Prevention in the Home Falls can cause injuries. They can happen to people of all ages. There are many things you can do to make your home safe and to help prevent falls. What can I do on the outside of my home? Regularly fix the edges of walkways and driveways and fix any cracks. Remove anything that might make you trip as you walk through a door, such as a raised step or threshold. Trim any bushes or trees on the path to your home. Use bright outdoor lighting. Clear any walking paths of anything that might make someone trip, such as rocks or tools. Regularly check to see if handrails are loose or broken. Make sure that both sides of any steps have handrails. Any raised decks and porches should have guardrails on the edges. Have any leaves, snow, or ice cleared regularly. Use sand or salt on walking paths during winter. Clean up any spills in your garage right away. This includes oil or grease spills. What can I do in the bathroom? Use night lights. Install grab bars by the toilet and in the tub and shower. Do not use towel bars as grab bars. Use non-skid mats or decals in the tub or shower. If you need to sit down in the shower, use a plastic, non-slip stool. Keep the floor dry. Clean up any water that spills on the floor as soon as it happens. Remove soap buildup in the tub or shower regularly. Attach bath mats securely with double-sided non-slip rug tape. Do not have throw rugs and other things on the floor that can make you trip. What can I do in the bedroom? Use night lights. Make sure that you have a light by your bed that is easy to reach. Do not use any sheets or blankets that are too big for your bed.  They should not hang down onto the floor. Have a firm chair that has side arms. You can use this for support while you get dressed. Do not have throw rugs and other things on the floor that can make you trip. What can I do in the kitchen? Clean up any spills right away. Avoid walking on wet floors. Keep items that you use a lot in easy-to-reach places. If you need to reach something above you, use a strong step stool that has a grab bar. Keep electrical cords out of the way. Do not use floor polish or wax that makes floors slippery. If you must use wax, use non-skid floor wax. Do not have throw rugs and other things on the floor that can make you trip. What can I do with my stairs? Do not leave any items on the stairs. Make sure that there are handrails on both sides of the stairs and use them. Fix handrails that are broken or loose. Make sure that handrails are as long as the stairways. Check any carpeting to make sure that it is firmly attached to the stairs. Fix any carpet that is loose or worn. Avoid having throw rugs at the top or bottom of the stairs. If you do have throw rugs, attach them to the floor with carpet tape. Make sure that you have a light switch at the  top of the stairs and the bottom of the stairs. If you do not have them, ask someone to add them for you. What else can I do to help prevent falls? Wear shoes that: Do not have high heels. Have rubber bottoms. Are comfortable and fit you well. Are closed at the toe. Do not wear sandals. If you use a stepladder: Make sure that it is fully opened. Do not climb a closed stepladder. Make sure that both sides of the stepladder are locked into place. Ask someone to hold it for you, if possible. Clearly mark and make sure that you can see: Any grab bars or handrails. First and last steps. Where the edge of each step is. Use tools that help you move around (mobility aids) if they are needed. These  include: Canes. Walkers. Scooters. Crutches. Turn on the lights when you go into a dark area. Replace any light bulbs as soon as they burn out. Set up your furniture so you have a clear path. Avoid moving your furniture around. If any of your floors are uneven, fix them. If there are any pets around you, be aware of where they are. Review your medicines with your doctor. Some medicines can make you feel dizzy. This can increase your chance of falling. Ask your doctor what other things that you can do to help prevent falls. This information is not intended to replace advice given to you by your health care provider. Make sure you discuss any questions you have with your health care provider. Document Released: 01/27/2009 Document Revised: 09/08/2015 Document Reviewed: 05/07/2014 Elsevier Interactive Patient Education  2017 [employer].

## 2020-12-29 NOTE — Progress Notes (Signed)
Subjective:   David Dalton is a 65 y.o. male who presents for an Initial Medicare Annual Wellness Visit.  I connected with  David Dalton on 12/29/20 by a telephone enabled telemedicine application and verified that I am speaking with the correct person using two identifiers.   I discussed the limitations of evaluation and management by telemedicine. The patient expressed understanding and agreed to proceed.   Review of Systems     Cardiac Risk Factors include: advanced age (>48mn, >>54women);hypertension;male gender;diabetes mellitus     Objective:    Today's Vitals   There is no height or weight on file to calculate BMI.  Advanced Directives 12/29/2020  Does Patient Have a Medical Advance Directive? No  Would patient like information on creating a medical advance directive? No - Patient declined    Current Medications (verified) Outpatient Encounter Medications as of 12/29/2020  Medication Sig   blood glucose meter kit and supplies Dispense based on patient and insurance preference. Use once per day.   Cholecalciferol (VITAMIN D3) 250 MCG (10000 UT) TABS Take 1 tablet by mouth daily.   docusate sodium (COLACE) 100 MG capsule Take 100 mg by mouth 2 (two) times daily.   glucose blood test strip Use as instructed once per day   Lancets (FREESTYLE) lancets Use as instructed once per day.   lisinopril (ZESTRIL) 10 MG tablet Take 1 tablet by mouth once daily   pantoprazole (PROTONIX) 40 MG tablet Take 1 tablet (40 mg total) by mouth daily.   No facility-administered encounter medications on file as of 12/29/2020.    Allergies (verified) Patient has no known allergies.   History: Past Medical History:  Diagnosis Date   Anxiety    History of chickenpox    Hypertension    Past Surgical History:  Procedure Laterality Date   FINGER SURGERY     Family History  Problem Relation Age of Onset   Cancer Mother    Anxiety disorder Mother    Diabetes Mother    Alcohol abuse  Father    Heart disease Father    Heart attack Father 667  Cancer Sister    Diabetes Brother    Cancer Sister    Cancer Sister    Social History   Socioeconomic History   Marital status: Married    Spouse name: Not on file   Number of children: Not on file   Years of education: Not on file   Highest education level: Not on file  Occupational History   Not on file  Tobacco Use   Smoking status: Never   Smokeless tobacco: Never  Vaping Use   Vaping Use: Never used  Substance and Sexual Activity   Alcohol use: Not Currently   Drug use: Not Currently   Sexual activity: Yes  Other Topics Concern   Not on file  Social History Narrative   Not on file   Social Determinants of Health   Financial Resource Strain: Low Risk    Difficulty of Paying Living Expenses: Not hard at all  Food Insecurity: No Food Insecurity   Worried About RCharity fundraiserin the Last Year: Never true   [place] Last [place]: Never true  Transportation Needs: No Transportation Needs   Lack of Transportation (Medical): No   Lack of Transportation (Non-Medical): No  Physical Activity: Insufficiently Active   Days of Exercise per Week: 3 days   Minutes of Exercise per Session: 40 min  Stress: No Stress Concern Present   Feeling of Stress : Not at all  Social Connections: Socially Isolated   Frequency of Communication with Friends and Family: More than three times a week   Frequency of Social Gatherings with Friends and Family: More than three times a week   Attends Religious Services: Never   Marine scientist or Organizations: No   Attends Music therapist: Never   Marital Status: Separated    Tobacco Counseling Counseling given: Not Answered   Clinical Intake:  Pre-visit preparation completed: No  Pain : No/denies pain     Nutritional Risks: None Diabetes: Yes CBG done?: No Did pt. bring in CBG monitor from home?: No  How often do you need to have  someone help you when you read instructions, pamphlets, or other written materials from your doctor or pharmacy?: 1 - Never  Diabetic?  Yes  Nutrition Risk Assessment:  Has the patient had any N/V/D within the last 2 months?  No  Does the patient have any non-healing wounds?  No  Has the patient had any unintentional weight loss or weight gain?  No   Diabetes:  Is the patient diabetic?  Yes  If diabetic, was a CBG obtained today?  No  Did the patient bring in their glucometer from home?  No  How often do you monitor your CBG's? Does not check..   Financial Strains and Diabetes Management:  Are you having any financial strains with the device, your supplies or your medication? No .  Does the patient want to be seen by Chronic Care Management for management of their diabetes?  No  Would the patient like to be referred to a Nutritionist or for Diabetic Management?  No   Diabetic Exams:  Diabetic Eye Exam: Completed [facility] [place]. Diabetic Foot Exam: Completed . Pt has been advised about the importance in completing this exam.    Interpreter Needed?: No  Information entered by :: Leroy Kennedy LPN   Activities of Daily Living In your present state of health, do you have any difficulty performing the following activities: 12/29/2020  Hearing? N  Vision? N  Difficulty concentrating or making decisions? N  Walking or climbing stairs? N  Dressing or bathing? N  Doing errands, shopping? N  Preparing Food and eating ? N  Using the Toilet? N  In the past six months, have you accidently leaked urine? N  Do you have problems with loss of bowel control? N  Managing your Medications? N  Managing your Finances? N  Housekeeping or managing your Housekeeping? N  Some recent data might be hidden    Patient Care Team: Wendie Agreste, MD as PCP - General (Family Medicine) Madelin Rear, [facility] as Pharmacist (Pharmacist)  Indicate any recent Medical Services you may have received from  other than [facility] providers in the past year (date may be approximate).     Assessment:   This is a routine wellness examination for David Dalton.  Hearing/Vision screen Hearing Screening - Comments:: No trouble hearing  Vision Screening - Comments:: Up to date [facility] [place]  Dietary issues and exercise activities discussed: Current Exercise Habits: Home exercise routine (total home gym), Time (Minutes): 40, Frequency (Times/Week): 3, Weekly Exercise (Minutes/Week): 120   Goals Addressed             This Visit's Progress    Weight (lb) < 200 lb (90.7 kg)         Depression Screen [facility] 2/9  Scores 12/29/2020 11/17/2020 09/15/2020 08/15/2020 07/06/2019 05/13/2018  PHQ - 2 Score 0 0 0 0 0 0  PHQ- 9 Score 0 1 0 - - -    Fall Risk Fall Risk  12/29/2020 11/17/2020 09/15/2020 08/15/2020 07/06/2019  Falls in the past year? 0 0 0 0 0  Number falls in past yr: 0 0 - - 0  Injury with Fall? 0 0 - - 0  Risk for fall due to : - No Fall Risks - - -  Follow up Falls evaluation completed;Education provided;Falls prevention discussed Falls evaluation completed Falls evaluation completed Falls evaluation completed Falls evaluation completed    FALL RISK PREVENTION PERTAINING TO THE HOME:  Any stairs in or around the home? Yes  If so, are there any without handrails? No  Home free of loose throw rugs in walkways, pet beds, electrical cords, etc? Yes  Adequate lighting in your home to reduce risk of falls? Yes   ASSISTIVE DEVICES UTILIZED TO PREVENT FALLS:  Life alert? No  Use of a cane, walker or w/c? No  Grab bars in the bathroom? No  Shower chair or bench in shower? No  Elevated toilet seat or a handicapped toilet? No   TIMED UP AND GO:  Was the test performed? No .   Cognitive Function: Normal cognitive status assessed by direct observation by this Nurse Health Advisor. No abnormalities found.      Immunizations Immunization History  Administered Date(s) Administered   Tdap 01/02/2019     TDAP status: Up to date  Flu Vaccine status: Declined, Education has been provided regarding the importance of this vaccine but patient still declined. Advised may receive this vaccine at local pharmacy or Health Dept. Aware to provide a copy of the vaccination record if obtained from local pharmacy or Health Dept. Verbalized acceptance and understanding.  Pneumococcal vaccine status: Declined,  Education has been provided regarding the importance of this vaccine but patient still declined. Advised may receive this vaccine at local pharmacy or Health Dept. Aware to provide a copy of the vaccination record if obtained from local pharmacy or Health Dept. Verbalized acceptance and understanding.   Covid-19 vaccine status: Declined, Education has been provided regarding the importance of this vaccine but patient still declined. Advised may receive this vaccine at local pharmacy or Health Dept.or vaccine clinic. Aware to provide a copy of the vaccination record if obtained from local pharmacy or Health Dept. Verbalized acceptance and understanding.  Qualifies for Shingles Vaccine? Yes   Zostavax completed No   Shingrix Completed?: No.    Education has been provided regarding the importance of this vaccine. Patient has been advised to call insurance company to determine out of pocket expense if they have not yet received this vaccine. Advised may also receive vaccine at local pharmacy or Health Dept. Verbalized acceptance and understanding.  Screening Tests Health Maintenance  Topic Date Due   OPHTHALMOLOGY EXAM  Never done   COVID-19 Vaccine (1) 01/14/2021 (Originally 10/15/1955)   Zoster Vaccines- Shingrix (1 of 2) 03/30/2021 (Originally 04/16/2004)   INFLUENZA VACCINE  07/14/2021 (Originally 11/14/2020)   FOOT EXAM  08/15/2021 (Originally 11/19/2019)   Hepatitis C Screening  08/15/2021 (Originally 04/16/1973)   PNA vac Low Risk Adult (1 of 2 - PCV13) 08/15/2021 (Originally 04/17/2019)   HEMOGLOBIN A1C   02/15/2021   TETANUS/TDAP  01/01/2029   COLONOSCOPY (Pts 45-30yr Insurance coverage will need to be confirmed)  06/30/2030   HPV VACCINES  Aged Out    Health Maintenance  Health Maintenance Due  Topic Date Due   OPHTHALMOLOGY EXAM  Never done    Colorectal cancer screening: Type of screening: Colonoscopy. Completed 2022. Repeat every 2032 years  Lung Cancer Screening: (Low Dose CT Chest recommended if Age 63-80 years, 30 pack-year currently smoking OR have quit w/in 15years.) does not qualify.   Lung Cancer Screening Referral:   Additional Screening:  Hepatitis C Screening: does not qualify; Completed 2017  Vision Screening: Recommended annual ophthalmology exams for early detection of glaucoma and other disorders of the eye. Is the patient up to date with their annual eye exam?  Yes  Who is the provider or what is the name of the office in which the patient attends annual eye exams? [facility] [place] If pt is not established with a provider, would they like to be referred to a provider to establish care? No .   Dental Screening: Recommended annual dental exams for proper oral hygiene  Community Resource Referral / Chronic Care Management: CRR required this visit?  No   CCM required this visit?  No      Plan:     I have personally reviewed and noted the following in the patient's chart:   Medical and social history Use of alcohol, tobacco or illicit drugs  Current medications and supplements including opioid prescriptions. Patient is not currently taking opioid prescriptions. Functional ability and status Nutritional status Physical activity Advanced directives List of other physicians Hospitalizations, surgeries, and ER visits in previous 12 months Vitals Screenings to include cognitive, depression, and falls Referrals and appointments  In addition, I have reviewed and discussed with patient certain preventive protocols, quality metrics, and best practice  recommendations. A written personalized care plan for preventive services as well as general preventive health recommendations were provided to patient.     Leroy Kennedy, LPN   [id number]/7741   Nurse Notes:

## 2021-01-03 NOTE — Progress Notes (Signed)
Chronic Care Management Pharmacy Note  01/05/2021 Name:  David Dalton MRN:  [id number] DOB:  06/03/1955  Summary: -No Rx changes.  -Reviewed side effect concerns with statin and metformin. Might be agreeable to starting 02/2021 if labs uncontrolled.  Subjective: David Dalton is an 65 y.o. year old male who is a primary patient of Neva Seat, Asencion Partridge, MD.  The CCM team was consulted for assistance with disease management and care coordination needs.    Engaged with patient by telephone for initial visit in response to provider referral for pharmacy case management and/or care coordination services.   Consent to Services:  The patient was given information about Chronic Care Management services, agreed to services, and gave verbal consent prior to initiation of services.  Please see initial visit note for detailed documentation.   Patient Care Team: Shade Flood, MD as PCP - General (Family Medicine) Dahlia Byes, [facility] as Pharmacist (Pharmacist)  Hospital visits: None in previous 6 months  Objective:  Lab Results  Component Value Date   CREATININE 0.81 08/15/2020   CREATININE 0.85 09/17/2019   CREATININE 1.09 11/20/2018    Lab Results  Component Value Date   HGBA1C 7.8 (H) 08/15/2020   Last diabetic Eye exam: No results found for: HMDIABEYEEXA  Last diabetic Foot exam: No results found for: HMDIABFOOTEX      Component Value Date/Time   CHOL 282 (H) 08/15/2020 1236   TRIG (H) 08/15/2020 1236    426.0 Triglyceride is over 400; calculations on Lipids are invalid.   HDL 37.50 (L) 08/15/2020 1236   CHOLHDL 8 08/15/2020 1236   VLDL 60.6 (H) 11/20/2018 0808   LDLDIRECT 157.0 08/15/2020 1236    Hepatic Function Latest Ref Rng & Units 08/15/2020 09/17/2019 11/20/2018  Total Protein 6.1 - 8.1 g/dL 7.1 7.0 6.4  Albumin 3.5 - 5.2 g/dL - 4.5 4.2  AST 10 - 35 U/L 36(H) 27 20  ALT 9 - 46 U/L 42 33 26  Alk Phosphatase 39 - 117 U/L - 83 78  Total Bilirubin 0.2 - 1.2 mg/dL 0.6  0.6 0.5   No results found for: TSH, FREET4  CBC Latest Ref Rng & Units 08/15/2020 09/17/2019 08/03/2008  WBC 4.0 - 10.5 K/uL 11.7(H) 9.8 12.0(H)  Hemoglobin 13.0 - 17.0 g/dL 88.5 20.7 40.9  Hematocrit 39.0 - 52.0 % 44.7 44.0 41.9  Platelets 150.0 - 400.0 K/uL 372.0 311.0 295    Lab Results  Component Value Date/Time   VD25OH 55.53 05/13/2018 09:27 AM    Clinical ASCVD: No  The ASCVD Risk score (Arnett DK, et al., 2019) failed to calculate for the following reasons:   The systolic blood pressure is missing      Social History   Tobacco Use  Smoking Status Never  Smokeless Tobacco Never   BP Readings from Last 3 Encounters:  11/17/20 132/76  09/15/20 122/74  08/15/20 132/82   Pulse Readings from Last 3 Encounters:  11/17/20 74  09/15/20 72  08/15/20 92   Wt Readings from Last 3 Encounters:  11/17/20 213 lb 9.6 oz (96.9 kg)  09/15/20 214 lb 6.4 oz (97.3 kg)  08/15/20 215 lb (97.5 kg)    Assessment: Review of patient past medical history, allergies, medications, health status, including review of consultants reports, laboratory and other test data, was performed as part of comprehensive evaluation and provision of chronic care management services.   SDOH:  (Social Determinants of Health) assessments and interventions performed: Yes   CCM Care  Plan  No Known Allergies  Medications Reviewed Today     Reviewed by Madelin Rear, [facility] (Pharmacist) on 01/05/21 at 1307  Med List Status: <None>   Medication Order Taking? Sig Documenting Provider Last Dose Status Informant  blood glucose meter kit and supplies [id number] No Dispense based on patient and insurance preference. Use once per day. Wendie Agreste, MD Taking Active   Cholecalciferol (VITAMIN D3) 250 MCG (10000 UT) TABS [id number] No Take 1 tablet by mouth daily. [provider] Taking Active   docusate sodium (COLACE) 100 MG capsule [id number] No Take 100 mg by mouth 2 (two) times daily. [provider] Taking Active   glucose blood test strip [id number] No Use as instructed once per day Wendie Agreste, MD Taking Active   Lancets (FREESTYLE) lancets [id number] No Use as instructed once per day. Wendie Agreste, MD Taking Active   lisinopril (ZESTRIL) 10 MG tablet [id number] No Take 1 tablet by mouth once daily Wendie Agreste, MD Taking Active   pantoprazole (PROTONIX) 40 MG tablet [id number] No Take 1 tablet (40 mg total) by mouth daily. 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   ASBESTOS EXPOSURE 08/11/2008   ESOPHAGEAL STRICTURE 03/01/2008   GERD 03/01/2008   HIATAL HERNIA 03/01/2008    Immunization History  Administered Date(s) Administered   Tdap 01/02/2019    Conditions to be addressed/monitored: HTN, HLD, Hypertriglyceridemia, and DMII  Care Plan : ccm pharmacy care plan  Updates made by Madelin Rear, [facility] since 01/05/2021 12:00 AM     Problem: HTN, HLD, Hypertriglyceridemia, and DMII      Long-Range Goal: Disease Management   Start Date: 01/05/2021  Expected End Date: 01/05/2022  This Visit's Progress: On track  Priority: High  Note:   Current Barriers:  Unable to achieve control of DMII, HLD   Pharmacist Clinical Goal(s):  Patient will verbalize ability to afford treatment regimen contact provider office for questions/concerns as evidenced notation of same in electronic health record through collaboration with PharmD and provider.   Interventions: 1:1 collaboration with Wendie Agreste, MD regarding development and update of comprehensive plan of care as evidenced by provider attestation and co-signature Inter-disciplinary care team collaboration (see  longitudinal plan of care) Comprehensive medication review performed; medication list updated in electronic medical record  Hypertension (BP goal <130/80) -Controlled -Current treatment: Lisinopril 10 mg once daily  -Medications previously tried: n/a  -Current home readings: reports <130/80 -Denies hypotensive/hypertensive symptoms -Educated on BP goals and benefits of medications for prevention of heart attack, stroke and kidney damage; Importance of home blood pressure monitoring; -Counseled to monitor BP at home 1-2x/wk, document, and provide log at future appointments -Recommended continue current medication  Diabetes (A1c goal <7%) -Not ideally controlled -Concerned with kidney issues and metformin.  Expressed some concern with statins and a friend that had muscle pain while taking lipitor. -Current medications: No current medications  -Medications previously tried: none  -Current home glucose readings fasting glucose: not testing post prandial glucose: not testing -Denies hypoglycemic/hyperglycemic symptoms -Current meal patterns: tries to follow low carb diet. Bran cereal for breakfast. Has made numerous dietary changes, and done research on foods and how  they can impact dm.  -Reviewed potential side effects and role of patient monitoring/reporting of side effects   -Current exercise: has gotten back to [employer] in [place].  -Educated on A1c and blood sugar goals; Complications of diabetes including kidney damage, retinal damage, and cardiovascular disease; Benefits of routine self-monitoring of blood sugar; -Counseled to check feet daily and get yearly eye exams -Recommended for patient to consider starting metformin and agreed waiting for next labs would help guide next steps for statin and metformin use   Patient Goals/Self-Care Activities Patient will:  - target a minimum of 150 minutes of moderate intensity exercise weekly  Follow Up Plan: The  Central Pharmacy team will follow up with the patient and will provide direct communication to the PCP for this patient.   Medication Assistance: None required.  Patient affirms current coverage meets needs.  Patient's preferred pharmacy is:  [facility] [address], [place] [address] [place] [address] [place] [address] Phone: [phone] Fax: [phone]  Pt endorses 100% compliance  Follow Up:  Patient agrees to Care Plan and Follow-up.  Plan: F/u with pharmacist 3 months. 1 month DM call to see if patient has any home readings to provide    Future Appointments  Date Time Provider [place]  03/02/2021 10:20 AM Wendie Agreste, MD [facility]  04/19/2021 11:30 AM [facility] PHARMACIST [facility]

## 2021-01-04 ENCOUNTER — Ambulatory Visit (INDEPENDENT_AMBULATORY_CARE_PROVIDER_SITE_OTHER): Payer: Medicare HMO

## 2021-01-04 DIAGNOSIS — I10 Essential (primary) hypertension: Secondary | ICD-10-CM

## 2021-01-04 DIAGNOSIS — E119 Type 2 diabetes mellitus without complications: Secondary | ICD-10-CM

## 2021-01-05 NOTE — Patient Instructions (Addendum)
David Dalton,  Thank you for talking with me today. I have included our care plan/goals in the following pages.   Please review and call me at [phone] with any questions.  Thanks! Ellin Mayhew, PharmD, CPP Clinical Pharmacist Practitioner  [phone]  Care Plan : ccm pharmacy care plan  Updates made by Madelin Rear, [facility] since 01/05/2021 12:00 AM     Problem: HTN, HLD, Hypertriglyceridemia, and DMII      Long-Range Goal: Disease Management   Start Date: 01/05/2021  Expected End Date: 01/05/2022  This Visit's Progress: On track  Priority: High  Note:   Current Barriers:  Unable to achieve control of DMII, HLD   Pharmacist Clinical Goal(s):  Patient will verbalize ability to afford treatment regimen contact provider office for questions/concerns as evidenced notation of same in electronic health record through collaboration with PharmD and provider.   Interventions: 1:1 collaboration with Wendie Agreste, MD regarding development and update of comprehensive plan of care as evidenced by provider attestation and co-signature Inter-disciplinary care team collaboration (see longitudinal plan of care) Comprehensive medication review performed; medication list updated in electronic medical record  Hypertension (BP goal <130/80) -Controlled -Current treatment: Lisinopril 10 mg once daily  -Medications previously tried: n/a  -Current home readings: reports <130/80 -Denies hypotensive/hypertensive symptoms -Educated on BP goals and benefits of medications for prevention of heart attack, stroke and kidney damage; Importance of home blood pressure monitoring; -Counseled to monitor BP at home 1-2x/wk, document, and provide log at future appointments -Recommended continue current medication  Diabetes (A1c goal <7%) -Not ideally controlled -Current medications: No current medications  -Medications previously tried: none  -Current home glucose readings fasting  glucose: not testing post prandial glucose: not testing -Denies hypoglycemic/hyperglycemic symptoms -Current meal patterns: tries to follow low carb diet. Bran cereal for breakfast. Has made numerous dietary changes, and done research on foods and how they can impact dm.  -Reviewed potential side effects and role of patient monitoring/reporting of side effects   -Current exercise: has gotten back to [employer] in [place].  -Educated on A1c and blood sugar goals: Goal for fasting <130, goal for 2 hours after first bite of meal <295 Complications of diabetes including kidney damage, retinal damage, and cardiovascular disease; Benefits of routine self-monitoring of blood sugar; -Counseled to check feet daily and get yearly eye exams -Recommended for patient to consider starting metformin and agreed waiting for next labs would help guide next steps for statin and metformin use   Patient Goals/Self-Care Activities Patient will:  - target a minimum of 150 minutes of moderate intensity exercise weekly  Follow Up Plan: The [facility] Pharmacy team will follow up with the patient and will provide direct communication to the PCP for this patient.   Medication Assistance: None required.  Patient affirms current coverage meets needs.  Patient's preferred pharmacy is:  [facility] [address], [place] [address] [place] [address] [place] [address] Phone: [phone] Fax: [phone]  Pt endorses 100% compliance  Follow Up:  Patient agrees to Care Plan and Follow-up.  Plan: F/u with pharmacist 3 months. 1 month DM call to see if patient has any home readings to provide   The patient was given the following information about Chronic Care Management services today, agreed to services, and gave verbal consent: 1. CCM service includes personalized support from designated clinical staff supervised by the primary care provider, including individualized plan of care and  coordination with other care providers 2. 24/7 contact phone numbers for assistance for urgent and routine care needs. 3. Service will only be billed when office clinical staff spend 20 minutes or more in a month to coordinate care. 4. Only one practitioner may furnish and bill the service in a calendar month. 5.The patient may stop CCM services at any time (effective at the end of the month) by phone call to the office staff. 6. The patient will be responsible for cost sharing (co-pay) of up to 20% of the service fee (after annual deductible is met). Patient agreed to services and consent obtained.  The patient verbalized understanding of instructions provided today and agreed to receive a copy of patient instruction and/or educational materials via mail. Telephone follow up appointment with pharmacy team member scheduled for: See next appointment with "Care Management Staff" under "What's Next" below.

## 2021-01-13 DIAGNOSIS — E119 Type 2 diabetes mellitus without complications: Secondary | ICD-10-CM | POA: Diagnosis not present

## 2021-01-13 DIAGNOSIS — I10 Essential (primary) hypertension: Secondary | ICD-10-CM

## 2021-01-23 DIAGNOSIS — M25512 Pain in left shoulder: Secondary | ICD-10-CM | POA: Diagnosis not present

## 2021-02-08 DIAGNOSIS — M25551 Pain in right hip: Secondary | ICD-10-CM | POA: Diagnosis not present

## 2021-03-01 ENCOUNTER — Telehealth: Payer: Self-pay

## 2021-03-02 ENCOUNTER — Ambulatory Visit: Payer: Medicare HMO | Admitting: Family Medicine

## 2021-03-02 NOTE — Telephone Encounter (Signed)
Error

## 2021-03-03 ENCOUNTER — Other Ambulatory Visit: Payer: Self-pay | Admitting: Family Medicine

## 2021-03-03 DIAGNOSIS — I10 Essential (primary) hypertension: Secondary | ICD-10-CM

## 2021-03-03 NOTE — Telephone Encounter (Signed)
Patient called and is down to 1 pill so he will need the refill today.  He also said thank you and have a good weekend!

## 2021-04-18 NOTE — Progress Notes (Deleted)
Chronic Care Management Pharmacy Note  04/18/2021 Name:  David Dalton MRN:  [id number] DOB:  13-Apr-1956  Summary: -No Rx changes.  -Reviewed side effect concerns with statin and metformin. Might be agreeable to starting 02/2021 if labs uncontrolled.  Subjective: David Dalton is an 66 y.o. year old male who is a primary patient of Carlota Raspberry, Ranell Patrick, MD.  The CCM team was consulted for assistance with disease management and care coordination needs.    Engaged with patient by telephone for follow up visit in response to provider referral for pharmacy case management and/or care coordination services.   Consent to Services:  The patient was given information about Chronic Care Management services, agreed to services, and gave verbal consent prior to initiation of services.  Please see initial visit note for detailed documentation.   Patient Care Team: Wendie Agreste, MD as PCP - General (Family Medicine) Madelin Rear, [facility] as Pharmacist (Pharmacist)  Hospital visits: None in previous 6 months  Objective:  Lab Results  Component Value Date   CREATININE 0.81 08/15/2020   CREATININE 0.85 09/17/2019   CREATININE 1.09 11/20/2018    Lab Results  Component Value Date   HGBA1C 7.8 (H) 08/15/2020   Last diabetic Eye exam:  Lab Results  Component Value Date/Time   HMDIABEYEEXA No Retinopathy 12/27/2020 01:00 PM    Last diabetic Foot exam: No results found for: HMDIABFOOTEX      Component Value Date/Time   CHOL 282 (H) 08/15/2020 1236   TRIG (H) 08/15/2020 1236    426.0 Triglyceride is over 400; calculations on Lipids are invalid.   HDL 37.50 (L) 08/15/2020 1236   CHOLHDL 8 08/15/2020 1236   VLDL 60.6 (H) 11/20/2018 0808   LDLDIRECT 157.0 08/15/2020 1236    Hepatic Function Latest Ref Rng & Units 08/15/2020 09/17/2019 11/20/2018  Total Protein 6.1 - 8.1 g/dL 7.1 7.0 6.4  Albumin 3.5 - 5.2 g/dL - 4.5 4.2  AST 10 - 35 U/L 36(H) 27 20  ALT 9 - 46 U/L 42 33 26  Alk  Phosphatase 39 - 117 U/L - 83 78  Total Bilirubin 0.2 - 1.2 mg/dL 0.6 0.6 0.5   No results found for: TSH, FREET4  CBC Latest Ref Rng & Units 08/15/2020 09/17/2019 08/03/2008  WBC 4.0 - 10.5 K/uL 11.7(H) 9.8 12.0(H)  Hemoglobin 13.0 - 17.0 g/dL 15.1 15.1 14.9  Hematocrit 39.0 - 52.0 % 44.7 44.0 41.9  Platelets 150.0 - 400.0 K/uL 372.0 311.0 295    Lab Results  Component Value Date/Time   VD25OH 55.53 05/13/2018 09:27 AM    Clinical ASCVD: No  The ASCVD Risk score (Arnett DK, et al., 2019) failed to calculate for the following reasons:   The systolic blood pressure is missing      Social History   Tobacco Use  Smoking Status Never  Smokeless Tobacco Never   BP Readings from Last 3 Encounters:  11/17/20 132/76  09/15/20 122/74  08/15/20 132/82   Pulse Readings from Last 3 Encounters:  11/17/20 74  09/15/20 72  08/15/20 92   Wt Readings from Last 3 Encounters:  11/17/20 213 lb 9.6 oz (96.9 kg)  09/15/20 214 lb 6.4 oz (97.3 kg)  08/15/20 215 lb (97.5 kg)    Assessment: Review of patient past medical history, allergies, medications, health status, including review of consultants reports, laboratory and other test data, was performed as part of comprehensive evaluation and provision of chronic care management services.   SDOH:  (  Social Determinants of Health) assessments and interventions performed: Yes   CCM Care Plan  No Known Allergies  Medications Reviewed Today     Reviewed by Madelin Rear, [facility] (Pharmacist) on 01/05/21 at 1307  Med List Status: <None>   Medication Order Taking? Sig Documenting Provider Last Dose Status Informant  blood glucose meter kit and supplies [id number] No Dispense based on patient and insurance preference. Use once per day. Wendie Agreste, MD Taking Active   Cholecalciferol (VITAMIN D3) 250 MCG (10000 UT) TABS [id number] No Take 1 tablet by mouth daily. [provider] Taking Active   docusate sodium (COLACE) 100 MG capsule  [id number] No Take 100 mg by mouth 2 (two) times daily. [provider] Taking Active   glucose blood test strip [id number] No Use as instructed once per day Wendie Agreste, MD Taking Active   Lancets (FREESTYLE) lancets [id number] No Use as instructed once per day. Wendie Agreste, MD Taking Active   lisinopril (ZESTRIL) 10 MG tablet [id number] No Take 1 tablet by mouth once daily Wendie Agreste, MD Taking Active   pantoprazole (PROTONIX) 40 MG tablet [id number] No Take 1 tablet (40 mg total) by mouth daily. 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   Future Appointments  Date Time Provider [place]  04/19/2021 11:30 AM [facility]CM PHARMACIST [facility]  04/20/2021 10:40 AM Wendie Agreste, MD [facility]     Current Barriers:  Unable to achieve control of DMII, HLD   Pharmacist Clinical Goal(s):  Patient will verbalize ability to afford treatment regimen contact provider office for questions/concerns as evidenced notation of same in electronic health record through collaboration with PharmD and provider.   Interventions: 1:1 collaboration with Wendie Agreste, MD regarding development and update of comprehensive plan of care as evidenced by provider attestation and co-signature Inter-disciplinary care team collaboration (see  longitudinal plan of care) Comprehensive medication review performed; medication list updated in electronic medical record  Hypertension (BP goal <130/80) -Controlled -Current treatment: Lisinopril 10 mg once daily  -Medications previously tried: n/a  -Current home readings: reports <130/80 -Denies hypotensive/hypertensive symptoms -Educated on BP goals and benefits of medications for prevention of heart attack, stroke and kidney damage; Importance of home blood pressure monitoring; -Counseled to monitor BP at home 1-2x/wk, document, and provide log at future appointments -Recommended continue current medication  Diabetes (A1c goal <7%) -Not ideally controlled -Concerned with kidney issues and metformin.  Expressed some concern with statins and a friend that had muscle pain while taking lipitor. -Current medications: No current medications  -Medications previously tried: none  -Current home glucose readings fasting glucose: not testing post prandial glucose: not testing -Denies hypoglycemic/hyperglycemic symptoms -Current meal patterns: tries to follow low carb diet. Bran cereal for breakfast. Has made numerous dietary changes, and done research on foods and how  they can impact dm.  -Reviewed potential side effects and role of patient monitoring/reporting of side effects   -Current exercise: has gotten back to [employer] in [place].  -Educated on A1c and blood sugar goals; Complications of diabetes including kidney damage, retinal damage, and cardiovascular disease; Benefits of routine self-monitoring of blood sugar; -Counseled to check feet daily and get yearly eye exams -Recommended for patient to consider starting metformin and agreed waiting for next labs would help guide next steps for statin and metformin use   Patient Goals/Self-Care Activities Patient will:  - target a minimum of 150 minutes of moderate intensity exercise weekly  Follow Up Plan: The  Central Pharmacy team will follow up with the patient and will provide direct communication to the PCP for this patient.   Medication Assistance: None required.  Patient affirms current coverage meets needs.  Patient's preferred pharmacy is:  [facility] [address] [place] [address] [address] [place] [address] Phone: [phone] Fax: [phone]  Pt endorses 100% compliance  Follow Up:  Patient agrees to Care Plan and Follow-up.  Plan: F/u with pharmacist 3 months. 1 month DM call to see if patient has any home readings to provide

## 2021-04-19 ENCOUNTER — Telehealth: Payer: Medicare HMO

## 2021-04-20 ENCOUNTER — Ambulatory Visit (INDEPENDENT_AMBULATORY_CARE_PROVIDER_SITE_OTHER): Payer: Medicare HMO | Admitting: Family Medicine

## 2021-04-20 ENCOUNTER — Encounter: Payer: Self-pay | Admitting: Family Medicine

## 2021-04-20 VITALS — BP 132/78 | HR 88 | Temp 98.3°F | Resp 16 | Ht 69.0 in | Wt 217.4 lb

## 2021-04-20 DIAGNOSIS — I10 Essential (primary) hypertension: Secondary | ICD-10-CM

## 2021-04-20 DIAGNOSIS — E1169 Type 2 diabetes mellitus with other specified complication: Secondary | ICD-10-CM | POA: Diagnosis not present

## 2021-04-20 DIAGNOSIS — E1165 Type 2 diabetes mellitus with hyperglycemia: Secondary | ICD-10-CM

## 2021-04-20 DIAGNOSIS — E785 Hyperlipidemia, unspecified: Secondary | ICD-10-CM | POA: Diagnosis not present

## 2021-04-20 DIAGNOSIS — G629 Polyneuropathy, unspecified: Secondary | ICD-10-CM

## 2021-04-20 LAB — GLUCOSE, POCT (MANUAL RESULT ENTRY): POC Glucose: 158 mg/dl — AB (ref 70–99)

## 2021-04-20 LAB — COMPREHENSIVE METABOLIC PANEL
ALT: 27 U/L (ref 0–53)
AST: 24 U/L (ref 0–37)
Albumin: 4.5 g/dL (ref 3.5–5.2)
Alkaline Phosphatase: 90 U/L (ref 39–117)
BUN: 16 mg/dL (ref 6–23)
CO2: 27 mEq/L (ref 19–32)
Calcium: 9.8 mg/dL (ref 8.4–10.5)
Chloride: 102 mEq/L (ref 96–112)
Creatinine, Ser: 0.89 mg/dL (ref 0.40–1.50)
GFR: 88.99 mL/min (ref >60.00)
Glucose, Bld: 121 mg/dL — ABNORMAL HIGH (ref 70–99)
Potassium: 4.3 mEq/L (ref 3.5–5.1)
Sodium: 138 mEq/L (ref 135–145)
Total Bilirubin: 0.6 mg/dL (ref 0.2–1.2)
Total Protein: 7.2 g/dL (ref 6.0–8.3)

## 2021-04-20 LAB — LIPID PANEL
Cholesterol: 315 mg/dL — ABNORMAL HIGH (ref 0–200)
HDL: 39 mg/dL — ABNORMAL LOW (ref >39.00)
NonHDL: 276.26
Total CHOL/HDL Ratio: 8
Triglycerides: 390 mg/dL — ABNORMAL HIGH (ref 0.0–149.0)
VLDL: 78 mg/dL — ABNORMAL HIGH (ref 0.0–40.0)

## 2021-04-20 LAB — POCT GLYCOSYLATED HEMOGLOBIN (HGB A1C): Hemoglobin A1C: 7.1 % — AB (ref 4.0–5.6)

## 2021-04-20 LAB — LDL CHOLESTEROL, DIRECT: Direct LDL: 197 mg/dL

## 2021-04-20 MED ORDER — GABAPENTIN 100 MG PO CAPS: 100.0000 mg | ORAL_CAPSULE | Freq: Every day | ORAL | 3 refills | Status: DC

## 2021-04-20 MED ORDER — LISINOPRIL 10 MG PO TABS: 10.0000 mg | ORAL_TABLET | Freq: Every day | ORAL | 1 refills | Status: DC

## 2021-04-20 NOTE — Progress Notes (Signed)
Subjective:  Patient ID: David Dalton, male    DOB: 07/10/1955  Age: 66 y.o. MRN: [id number]  CC:  Chief Complaint  Patient presents with   Diabetes    Pt reports doing okay, reports he has had some tingling in his feet has tried sisters gabapentin rx in past when worst and has helped    HPI David Dalton presents for   Diabetes: Complicated by hyperglycemia.  Initial diet approach.  Weight was improving last year.  Intermittent fasting and cut out carbs.  He is on ACE inhibitor, no statin.  Unfortunately A1c increased in May to 7.8.  Lipids elevated previously, but again diet approach planned. Weight up 4 pounds from last visit. No home readings. Tingling in feet at times past month or so - both sides. Notes with certain boots, no wounds or skin breakdown.notes at night with lying down.  Sister had neuropathy - tried sister's gabapentin and that helped symptoms.  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 Had R hip injection 6 weeks ago - foot tingling resolved.  No current meds  No change in thirst or blurry vision.  Avoiding sweets, carbs.   Microalbumin: Normal ratio 5/22 Optho, foot exam, pneumovax:  Up-to-date on ophthalmology exam, September 13 - told was ok.  Recommended flu vaccine, COVID-vaccine, pneumonia vaccine and Shingrix - he declines all of these at this time.  Discussed increased risk of complication score for [id number] if he were to become infected.  Also recommended home testing and video visit if he does test positive for COVID-19 to discuss antivirals. Fasting today.   Lab Results  Component Value Date   HGBA1C 7.8 (H) 08/15/2020   HGBA1C 7.2 (H) 09/17/2019   HGBA1C 6.7 (H) 11/20/2018   Lab Results  Component Value Date   MICROALBUR 1.2 08/15/2020   CREATININE 0.81 08/15/2020   Lab Results  Component Value Date   CHOL 282 (H) 08/15/2020   HDL 37.50 (L) 08/15/2020   LDLDIRECT 157.0 08/15/2020   TRIG (H) 08/15/2020    426.0 Triglyceride is over 400;  calculations on Lipids are invalid.   CHOLHDL 8 08/15/2020   Wt Readings from Last 3 Encounters:  04/20/21 217 lb 6.4 oz (98.6 kg)  11/17/20 213 lb 9.6 oz (96.9 kg)  09/15/20 214 lb 6.4 oz (97.3 kg)    Hypertension: Lisinopril 1m qd.  Home readings:130/70, no new side effects.  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 Authorizing Provider  blood glucose meter kit and supplies Dispense based on patient and insurance preference. Use once per day. 09/15/20  Yes GWendie Agreste MD  Cholecalciferol (VITAMIN D3) 250 MCG (10000  UT) TABS Take 1 tablet by mouth daily.   Yes [provider]  docusate sodium (COLACE) 100 MG capsule Take 100 mg by mouth 2 (two) times daily.   Yes [provider]  glucose blood test strip Use as instructed once per day 09/20/20  Yes Wendie Agreste, MD  Lancets (FREESTYLE) lancets Use as instructed once per day. 09/20/20  Yes Wendie Agreste, MD  lisinopril (ZESTRIL) 10 MG tablet Take 1 tablet by mouth once daily 03/03/21  Yes Wendie Agreste, MD  pantoprazole (PROTONIX) 40 MG tablet Take 1 tablet (40 mg total) by mouth daily. 09/17/19  Yes Brunetta Jeans, PA-C    Social History   Socioeconomic History   Marital status: Married    Spouse name: Not on file   Number of children: Not on file   Years of education: Not on file   Highest education level: Not on file  Occupational History   Not on file  Tobacco Use   Smoking status: Never   Smokeless tobacco: Never  Vaping Use   Vaping Use: Never used  Substance and Sexual Activity   Alcohol use: Not Currently   Drug use: Not Currently   Sexual activity: Yes  Other Topics Concern   Not on file  Social History Narrative   Not on file   Social Determinants of Health   Financial Resource Strain: Low Risk    Difficulty of Paying Living Expenses: Not hard at all  Food Insecurity: No Food Insecurity   Worried About Running Out of Food in the Last Year: Never true   [place] in the Last Year: Never true  Transportation Needs: No Transportation Needs   Lack of Transportation (Medical): No   Lack of Transportation (Non-Medical): No  Physical Activity: Insufficiently Active   Days of Exercise per Week: 3 days   Minutes of Exercise per Session: 40 min  Stress: No Stress Concern Present   Feeling of Stress : Not at all  Social Connections: Socially Isolated   Frequency of Communication with Friends and Family: More than three times a week   Frequency of Social Gatherings with Friends and Family: More than three times a week   Attends Religious Services: Never   Marine scientist or Organizations: No   Attends Archivist Meetings: Never   Marital Status: Separated  Intimate Partner Violence: Not At Risk   Fear of Current or Ex-Partner: No   Emotionally Abused: No   Physically Abused: No   Sexually Abused: No    Review of Systems  Constitutional:  Negative for fatigue and unexpected weight change.  Eyes:  Negative for visual disturbance.  Respiratory:  Negative for cough, chest tightness and shortness of breath.   Cardiovascular:  Negative for chest pain,  palpitations and leg swelling.  Gastrointestinal:  Negative for abdominal pain and blood in stool.  Neurological:  Negative for dizziness, light-headedness and headaches.    Objective:   Vitals:   04/20/21 1035  BP: 132/78  Pulse: 88  Resp: 16  Temp: 98.3 F (36.8 C)  TempSrc: Temporal  SpO2: 97%  Weight: 217 lb 6.4 oz (98.6 kg)  Height: 5' 9"  (1.753 m)     Physical Exam Vitals reviewed.  Constitutional:      Appearance: He is well-developed.  HENT:     Head: Normocephalic and atraumatic.  Neck:     Vascular: No carotid bruit or JVD.  Cardiovascular:     Rate  and Rhythm: Normal rate and regular rhythm.     Heart sounds: Normal heart sounds. No murmur heard. Pulmonary:     Effort: Pulmonary effort is normal.     Breath sounds: Normal breath sounds. No rales.  Musculoskeletal:     Right lower leg: No edema.     Left lower leg: No edema.  Skin:    General: Skin is warm and dry.  Neurological:     Mental Status: He is alert and oriented to person, place, and time.  Psychiatric:        Mood and Affect: Mood normal.   Results for orders placed or performed in visit on 04/20/21  Comprehensive metabolic panel  Result Value Ref Range   Sodium 138 135 - 145 mEq/L   Potassium 4.3 3.5 - 5.1 mEq/L   Chloride 102 96 - 112 mEq/L   CO2 27 19 - 32 mEq/L   Glucose, Bld 121 (H) 70 - 99 mg/dL   BUN 16 6 - 23 mg/dL   Creatinine, Ser 0.89 0.40 - 1.50 mg/dL   Total Bilirubin 0.6 0.2 - 1.2 mg/dL   Alkaline Phosphatase 90 39 - 117 U/L   AST 24 0 - 37 U/L   ALT 27 0 - 53 U/L   Total Protein 7.2 6.0 - 8.3 g/dL   Albumin 4.5 3.5 - 5.2 g/dL   GFR 88.99 >60.00 mL/min   Calcium 9.8 8.4 - 10.5 mg/dL  Lipid panel  Result Value Ref Range   Cholesterol 315 (H) 0 - 200 mg/dL   Triglycerides 390.0 (H) 0.0 - 149.0 mg/dL   HDL 39.00 (L) >39.00 mg/dL   VLDL 78.0 (H) 0.0 - 40.0 mg/dL   Total CHOL/HDL Ratio 8    NonHDL 276.26   LDL cholesterol, direct  Result Value Ref Range   Direct  LDL 197.0 mg/dL  POCT glucose (manual entry)  Result Value Ref Range   POC Glucose 158 (A) 70 - 99 mg/dl  POCT glycosylated hemoglobin (Hb A1C)  Result Value Ref Range   Hemoglobin A1C 7.1 (A) 4.0 - 5.6 %   HbA1c POC (<> result, manual entry)     HbA1c, POC (prediabetic range)     HbA1c, POC (controlled diabetic range)      Assessment & Plan:  VARNELL ORVIS is a 66 y.o. male . Type 2 diabetes mellitus with hyperglycemia, without long-term current use of insulin (HCC) - Plan: Comprehensive metabolic panel, POCT glucose (manual entry), POCT glycosylated hemoglobin (Hb A1C), Lipid panel  -A1c improved.  We will continue diet approach for now, recheck labs in 3 months.  Benign essential hypertension - Plan: lisinopril (ZESTRIL) 10 MG tablet Stable, continue lisinopril same dose.  Labs as above  Neuropathy - Plan: gabapentin (NEURONTIN) 100 MG capsule  -Less likely diabetic related but possible.  Possible lumbar spine source.  Trial of low-dose gabapentin with RTC precautions if higher dosing needed or worsening symptoms.  Hyperlipidemia associated with type 2 diabetes mellitus ([place])  -Repeat labs indicated higher LDL, will recommend statin.  We did discuss the benefits of statin medication with diabetes and cardiovascular risk when he was in office.  Also discussed possible side effects and risks of statins.  Meds ordered this encounter  Medications   lisinopril (ZESTRIL) 10 MG tablet    Sig: Take 1 tablet (10 mg total) by mouth daily.    Dispense:  90 tablet    Refill:  1   gabapentin (NEURONTIN) 100 MG capsule  Sig: Take 1 capsule (100 mg total) by mouth at bedtime.    Dispense:  90 capsule    Refill:  3   Patient Instructions  Foot tingling could be from back issues, but diabetes can also cause tingling. I will check labs. Gabapentin at bedtime if needed. If higher dose needed, please return to discuss further.  If cholesterol is elevated today, we really need to consider  starting a statin medicine - even if just a few days per week.     Signed,   Merri Ray, MD [place] 04/20/21 11:36 AM

## 2021-04-20 NOTE — Patient Instructions (Addendum)
Foot tingling could be from back issues, but diabetes can also cause tingling. I will check labs and let she know if there are changes needed. Gabapentin at bedtime if needed. If higher dose needed, please return to discuss further.  If cholesterol is elevated today, we really need to consider starting a statin medicine - even if just a few days per week.   Thanks for coming in today.

## 2021-04-25 ENCOUNTER — Telehealth: Payer: Self-pay

## 2021-04-25 NOTE — Telephone Encounter (Signed)
-----   Message from Wendie Agreste, MD sent at 04/20/2021  7:29 PM EST ----- Called pt - no answer. Please try to call again: Good news.  Hemoglobin A1c/49-month blood sugar has improved.  Electrolytes were overall stable.  Okay to continue to watch diet, exercise for diabetes treatment based on that level. Unfortunately cholesterol levels are elevated and higher than previous range.  At that level I do recommend a statin medication, as we discussed in the office.  We certainly can start that with a low-dose statin a few days per week initially and if tolerated can increase to once per day.  Let me know if that is okay and I will send that to his pharmacy with repeat labs in 6 weeks.

## 2021-04-25 NOTE — Telephone Encounter (Signed)
Pt called back and was in formed

## 2021-05-12 DIAGNOSIS — M1611 Unilateral primary osteoarthritis, right hip: Secondary | ICD-10-CM | POA: Diagnosis not present

## 2021-05-23 ENCOUNTER — Telehealth: Payer: Self-pay

## 2021-05-23 NOTE — Telephone Encounter (Signed)
From placed in bin to be completed

## 2021-05-24 NOTE — Telephone Encounter (Signed)
Pt has been scheduled.  °

## 2021-05-24 NOTE — Telephone Encounter (Signed)
Pt needs a visit for clearance we did not perform at most recent vist as we were unaware at the time he needed this done. Need to perform EKG as well.

## 2021-05-25 ENCOUNTER — Encounter: Payer: Self-pay | Admitting: Family Medicine

## 2021-05-25 ENCOUNTER — Ambulatory Visit (INDEPENDENT_AMBULATORY_CARE_PROVIDER_SITE_OTHER): Payer: Medicare HMO | Admitting: Family Medicine

## 2021-05-25 ENCOUNTER — Ambulatory Visit (INDEPENDENT_AMBULATORY_CARE_PROVIDER_SITE_OTHER)
Admission: RE | Admit: 2021-05-25 | Discharge: 2021-05-25 | Disposition: A | Discharge status: Home / Self Care | Payer: Medicare HMO | Source: Ambulatory Visit | Attending: Family Medicine | Admitting: Family Medicine

## 2021-05-25 ENCOUNTER — Other Ambulatory Visit: Payer: Self-pay

## 2021-05-25 VITALS — BP 134/72 | HR 68 | Temp 98.2°F | Resp 16 | Ht 69.0 in | Wt 219.6 lb

## 2021-05-25 DIAGNOSIS — R9431 Abnormal electrocardiogram [ECG] [EKG]: Secondary | ICD-10-CM | POA: Diagnosis not present

## 2021-05-25 DIAGNOSIS — Z01818 Encounter for other preprocedural examination: Secondary | ICD-10-CM | POA: Diagnosis not present

## 2021-05-25 DIAGNOSIS — R21 Rash and other nonspecific skin eruption: Secondary | ICD-10-CM | POA: Diagnosis not present

## 2021-05-25 DIAGNOSIS — E785 Hyperlipidemia, unspecified: Secondary | ICD-10-CM

## 2021-05-25 LAB — CBC
HCT: 46.5 % (ref 39.0–52.0)
Hemoglobin: 15.6 g/dL (ref 13.0–17.0)
MCHC: 33.7 g/dL (ref 30.0–36.0)
MCV: 86.1 fl (ref 78.0–100.0)
Platelets: 311 10*3/uL (ref 150.0–400.0)
RBC: 5.4 Mil/uL (ref 4.22–5.81)
RDW: 13.9 % (ref 11.5–15.5)
WBC: 9 10*3/uL (ref 4.0–10.5)

## 2021-05-25 LAB — PROTIME-INR
INR: 1 ratio (ref 0.8–1.0)
Prothrombin Time: 11.2 s (ref 9.6–13.1)

## 2021-05-25 MED ORDER — CLOTRIMAZOLE 1 % EX CREA: 1.0000 "application " | TOPICAL_CREAM | Freq: Two times a day (BID) | CUTANEOUS | 0 refills | Status: AC

## 2021-05-25 MED ORDER — ATORVASTATIN CALCIUM 10 MG PO TABS: 10.0000 mg | ORAL_TABLET | Freq: Every day | ORAL | 0 refills | Status: DC

## 2021-05-25 NOTE — Patient Instructions (Addendum)
Start Lipitor once per day for the cholesterol.  As we discussed you will likely need a higher dose of that medication so recheck in 6 weeks for repeat lipid test at that time.  If still elevated we can increase medication if you are tolerating it.  I did refill the cream for the genital rash as needed for now.  If you require cream more often or does not help symptoms be seen right away.   Although you do not have any symptoms concerning for heart disease the EKG was slightly abnormal today.  I will refer you to cardiology to evaluate that EKG and decide if further testing needed for cardiac clearance for your surgery.  Once I review your labs that should be able to complete your medical clearance for your surgeon.  Let me know if there are questions.

## 2021-05-25 NOTE — Progress Notes (Signed)
Subjective:  Patient ID: David Dalton, male    DOB: May 05, 1955  Age: 66 y.o. MRN: [id number]  CC:  Chief Complaint  Patient presents with   Medical Clearance    Is having right hip replacement is here for clearance, April 11,2023 is his surgery     HPI David Dalton presents for  Preoperative evaluation for right hip replacement, date of surgery planned April 11. Dr. Wynelle Link.   History of diabetes, diet controlled.  Hypertension, peripheral neuropathy treated with gabapentin and hyperlipidemia.  Elevated LDL and borderline A1c at his most recent visit in January.  Statin recommended. Agrees to low dose statin initially then increase if tolerated.   No CP/dyspnea. Walking and yardwork. Able to walk 2 flights of stairs without CP/dyspnea.  No hx of CHF, known ischemic cardiac disease TIA/CVA, CKD. No insulin use. No hx of OSA. No daytime somnolence.  No prior surgery.  RCRI/Goldman index score of 0.  METS over 6 as above without cardiac symptoms.  Occasional rash on penis with use of clotrimazole - uses every few weeks. Needs refill clotrimazole - working well.    Results for orders placed or performed in visit on 04/20/21  Comprehensive metabolic panel  Result Value Ref Range   Sodium 138 135 - 145 mEq/L   Potassium 4.3 3.5 - 5.1 mEq/L   Chloride 102 96 - 112 mEq/L   CO2 27 19 - 32 mEq/L   Glucose, Bld 121 (H) 70 - 99 mg/dL   BUN 16 6 - 23 mg/dL   Creatinine, Ser 0.89 0.40 - 1.50 mg/dL   Total Bilirubin 0.6 0.2 - 1.2 mg/dL   Alkaline Phosphatase 90 39 - 117 U/L   AST 24 0 - 37 U/L   ALT 27 0 - 53 U/L   Total Protein 7.2 6.0 - 8.3 g/dL   Albumin 4.5 3.5 - 5.2 g/dL   GFR 88.99 >60.00 mL/min   Calcium 9.8 8.4 - 10.5 mg/dL  Lipid panel  Result Value Ref Range   Cholesterol 315 (H) 0 - 200 mg/dL   Triglycerides 390.0 (H) 0.0 - 149.0 mg/dL   HDL 39.00 (L) >39.00 mg/dL   VLDL 78.0 (H) 0.0 - 40.0 mg/dL   Total CHOL/HDL Ratio 8    NonHDL 276.26   LDL cholesterol, direct   Result Value Ref Range   Direct LDL 197.0 mg/dL  POCT glucose (manual entry)  Result Value Ref Range   POC Glucose 158 (A) 70 - 99 mg/dl  POCT glycosylated hemoglobin (Hb A1C)  Result Value Ref Range   Hemoglobin A1C 7.1 (A) 4.0 - 5.6 %   HbA1c POC (<> result, manual entry)     HbA1c, POC (prediabetic range)     HbA1c, POC (controlled diabetic range)       History Patient Active Problem List   Diagnosis Date Noted   New onset type 2 diabetes mellitus ([place]) 05/20/2018   Anxiety state 05/13/2018   Diastasis recti 05/13/2018   Benign essential hypertension 11/16/2013   OBESITY 07/25/2009   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 Authorizing  Provider  Cholecalciferol (VITAMIN D3) 250 MCG (10000 UT) TABS Take 1 tablet by mouth daily.   Yes [provider]  docusate sodium (COLACE) 100 MG capsule Take 100 mg by mouth 2 (two) times daily.   Yes [provider]  gabapentin (NEURONTIN) 100 MG capsule Take 1 capsule (100 mg total) by mouth at bedtime. 04/20/21  Yes Wendie Agreste, MD  lisinopril (ZESTRIL) 10 MG tablet Take 1 tablet (10 mg total) by mouth daily. 04/20/21  Yes Wendie Agreste, MD  blood glucose meter kit and supplies Dispense based on patient and insurance preference. Use once per day. Patient not taking: Reported on 05/25/2021 09/15/20   Wendie Agreste, MD  glucose blood test strip Use as instructed once per day Patient not taking: Reported on 05/25/2021 09/20/20   Wendie Agreste, MD  Lancets (FREESTYLE) lancets Use as instructed once per day. Patient not taking: Reported on  05/25/2021 09/20/20   Wendie Agreste, MD  pantoprazole (PROTONIX) 40 MG tablet Take 1 tablet (40 mg total) by mouth daily. Patient not taking: Reported on 05/25/2021 09/17/19   Brunetta Jeans, PA-C   Social History   Socioeconomic History   Marital status: Married    Spouse name: Not on file   Number of children: Not on file   Years of education: Not on file   Highest education level: Not on file  Occupational History   Not on file  Tobacco Use   Smoking status: Never   Smokeless tobacco: Never  Vaping Use   Vaping Use: Never used  Substance and Sexual Activity   Alcohol use: Not Currently   Drug use: Not Currently   Sexual activity: Yes  Other Topics Concern   Not on file  Social History Narrative   Not on file   Social Determinants of Health   Financial Resource Strain: Low Risk    Difficulty of Paying Living Expenses: Not hard at all  Food Insecurity: No Food Insecurity   Worried About Running Out of Food in the Last Year: Never true   [place] in the Last Year: Never true  Transportation Needs: No Transportation Needs   Lack of Transportation (Medical): No   Lack of Transportation (Non-Medical): No  Physical Activity: Insufficiently Active   Days of Exercise per Week: 3 days   Minutes of Exercise per Session: 40 min  Stress: No Stress Concern Present   Feeling of Stress : Not at all  Social Connections: Socially Isolated   Frequency of Communication with Friends and Family: More than three times a week   Frequency of Social Gatherings with Friends and Family: More than three times a week   Attends Religious Services: Never   Marine scientist or Organizations: No   Attends Archivist Meetings: Never   Marital Status: Separated  Intimate Partner Violence: Not At Risk   Fear of Current or Ex-Partner: No   Emotionally Abused: No   Physically Abused: No   Sexually Abused: No    Review of Systems Per HPI.   Objective:   Vitals:    05/25/21 1038  BP: 134/72  Pulse: 68  Resp: 16  Temp: 98.2 F (36.8 C)  TempSrc: Temporal  SpO2: 96%  Weight: 219 lb 9.6 oz (99.6 kg)  Height: 5' 9"  (1.753 m)     Physical Exam Vitals reviewed.  Constitutional:      Appearance: He is well-developed.  HENT:     Head:  Normocephalic and atraumatic.  Neck:     Vascular: No carotid bruit or JVD.  Cardiovascular:     Rate and Rhythm: Normal rate and regular rhythm.     Heart sounds: Normal heart sounds. No murmur heard.   No gallop.  Pulmonary:     Effort: Pulmonary effort is normal.     Breath sounds: Normal breath sounds. No rales.  Musculoskeletal:     Right lower leg: No edema.     Left lower leg: No edema.  Skin:    General: Skin is warm and dry.  Neurological:     Mental Status: He is alert and oriented to person, place, and time.  Psychiatric:        Mood and Affect: Mood normal.    EKG: Sinus rhythm.  Prominent R in V1, possible anterior fascicular block with low voltage.  Nonspecific T waves throughout.  No prior EKG available for comparison.  Assessment & Plan:  David Dalton is a 66 y.o. male . Preoperative evaluation to rule out surgical contraindication - Plan: EKG 12-Lead, CBC, DG Chest 2 View, Protime-INR, CANCELED: Protime-INR Abnormal EKG - Plan: Ambulatory referral to Cardiology  -Check labs for medical preop clearance, but will refer to cardiology for cardiac clearance given abnormal EKG, no prior comparison, and cardiac risk factors of hypertension, hyperlipidemia, diabetes.  Asymptomatic as above.  Hyperlipidemia, unspecified hyperlipidemia type - Plan: atorvastatin (LIPITOR) 10 MG tablet  -Agreed to start statin given elevated lipids and diabetes.  Discussed potential need for high-dose statin but would like to start initially on Lipitor 10 mg daily, recheck labs in 6 weeks.  Penile rash - Plan: clotrimazole (LOTRIMIN) 1 % cream  -Intermittent symptoms, clotrimazole ordered with RTC precautions if  persistent or worsening symptoms. Meds ordered this encounter  Medications   atorvastatin (LIPITOR) 10 MG tablet    Sig: Take 1 tablet (10 mg total) by mouth daily.    Dispense:  90 tablet    Refill:  0   clotrimazole (LOTRIMIN) 1 % cream    Sig: Apply 1 application topically 2 (two) times daily. As needed for genital rash.    Dispense:  30 g    Refill:  0   Patient Instructions  Start Lipitor once per day for the cholesterol.  As we discussed you will likely need a higher dose of that medication so recheck in 6 weeks for repeat lipid test at that time.  If still elevated we can increase medication if you are tolerating it.  I did refill the cream for the genital rash as needed for now.  If you require cream more often or does not help symptoms be seen right away.   Although you do not have any symptoms concerning for heart disease the EKG was slightly abnormal today.  I will refer you to cardiology to evaluate that EKG and decide if further testing needed for cardiac clearance for your surgery.  Once I review your labs that should be able to complete your medical clearance for your surgeon.  Let me know if there are questions.      Signed,   Merri Ray, MD [place] 05/25/21 11:29 AM

## 2021-06-05 ENCOUNTER — Telehealth: Payer: Self-pay

## 2021-06-05 NOTE — Telephone Encounter (Signed)
Letter sent and called to inform pt but no answer LM

## 2021-06-05 NOTE — Telephone Encounter (Signed)
Caller name:Muneeb Carlis Abbott   On DPR? :Yes  Call back number:[phone]  Provider they see: Carlota Raspberry   Reason for call:Pt is calling he had moved and wrong address in the system and wants copy of lab letter that was sent out on 05/29/2020 regarding labs and surgical clearance

## 2021-06-29 NOTE — Progress Notes (Signed)
? ? ? ?Chronic Care Management ?Pharmacy Note ? ?07/05/2021 ?Name:  David Dalton MRN:  [id number] DOB:  Jun 21, 1955 ? ?Summary: ?PharmD FU visit.  Has PCP visit tomorrow - recommend recheck lipids.  He is tolerating statin well and reports adherence.  He does state that gabapentin is not working as well for him and he has a headache every time he takes it.  Plans to talk to PCP tomorrow about this. ? ?Subjective: ?David Dalton is an 66 y.o. year old male who is a primary patient of Carlota Raspberry, Ranell Patrick, MD.  The CCM team was consulted for assistance with disease management and care coordination needs.   ? ?Engaged with patient by telephone for initial visit in response to provider referral for pharmacy case management and/or care coordination services.  ? ?Consent to Services:  ?The patient was given information about Chronic Care Management services, agreed to services, and gave verbal consent prior to initiation of services.  Please see initial visit note for detailed documentation.  ? ?Patient Care Team: ?Wendie Agreste, MD as PCP - General (Family Medicine) ?Edythe Clarity, [facility] (Pharmacist) ? ?Hospital visits: ?None in previous 6 months ? ?Objective: ? ?Lab Results  ?Component Value Date  ? CREATININE 0.89 04/20/2021  ? CREATININE 0.81 08/15/2020  ? CREATININE 0.85 09/17/2019  ? ? ?Lab Results  ?Component Value Date  ? HGBA1C 7.1 (A) 04/20/2021  ? ?Last diabetic Eye exam:  ?Lab Results  ?Component Value Date/Time  ? HMDIABEYEEXA No Retinopathy 12/27/2020 01:00 PM  ?  ?Last diabetic Foot exam: No results found for: HMDIABFOOTEX  ? ?   ?Component Value Date/Time  ? CHOL 315 (H) 04/20/2021 1142  ? TRIG 390.0 (H) 04/20/2021 1142  ? HDL 39.00 (L) 04/20/2021 1142  ? CHOLHDL 8 04/20/2021 1142  ? VLDL 78.0 (H) 04/20/2021 1142  ? LDLDIRECT 197.0 04/20/2021 1142  ? ? ? ?  Latest Ref Rng & Units 04/20/2021  ? 11:42 AM 08/15/2020  ? 12:36 PM 09/17/2019  ? 11:37 AM  ?Hepatic Function  ?Total Protein 6.0 - 8.3 g/dL 7.2   7.1    7.0    ?Albumin 3.5 - 5.2 g/dL 4.5    4.5    ?AST 0 - 37 U/L 24   36   27    ?ALT 0 - 53 U/L 27   42   33    ?Alk Phosphatase 39 - 117 U/L 90    83    ?Total Bilirubin 0.2 - 1.2 mg/dL 0.6   0.6   0.6    ? ?No results found for: TSH, FREET4 ? ? ?  Latest Ref Rng & Units 05/25/2021  ? 11:38 AM 08/15/2020  ? 12:36 PM 09/17/2019  ? 11:37 AM  ?CBC  ?WBC 4.0 - 10.5 K/uL 9.0   11.7   9.8    ?Hemoglobin 13.0 - 17.0 g/dL 15.6   15.1   15.1    ?Hematocrit 39.0 - 52.0 % 46.5   44.7   44.0    ?Platelets 150.0 - 400.0 K/uL 311.0   372.0   311.0    ? ? ?Lab Results  ?Component Value Date/Time  ? VD25OH 55.53 05/13/2018 09:27 AM  ? ? ?Clinical ASCVD: No  ?The 10-year ASCVD risk score (Arnett DK, et al., 2019) is: 55.4% ?  Values used to calculate the score: ?    Age: 2 years ?    Sex: Male ?    Is Non-Hispanic African American: No ?  Diabetic: Yes ?    Tobacco smoker: Yes ?    Systolic Blood Pressure: 462 mmHg ?    Is BP treated: Yes ?    HDL Cholesterol: 39 mg/dL ?    Total Cholesterol: 315 mg/dL   ? ? ? ?Social History  ? ?Tobacco Use  ?Smoking Status Never  ?Smokeless Tobacco Never  ? ?BP Readings from Last 3 Encounters:  ?05/25/21 134/72  ?04/20/21 132/78  ?11/17/20 132/76  ? ?Pulse Readings from Last 3 Encounters:  ?05/25/21 68  ?04/20/21 88  ?11/17/20 74  ? ?Wt Readings from Last 3 Encounters:  ?05/25/21 219 lb 9.6 oz (99.6 kg)  ?04/20/21 217 lb 6.4 oz (98.6 kg)  ?11/17/20 213 lb 9.6 oz (96.9 kg)  ? ? ?Assessment: Review of patient past medical history, allergies, medications, health status, including review of consultants reports, laboratory and other test data, was performed as part of comprehensive evaluation and provision of chronic care management services.  ? ?SDOH:  (Social Determinants of Health) assessments and interventions performed: Yes ? ? ?[place] ? ?No Known Allergies ? ?Medications Reviewed Today   ? ? Reviewed by Edythe Clarity, [facility] (Pharmacist) on 07/05/21 at [phone]  Med List Status: <None>   ? ?Medication Order Taking? Sig Documenting Provider Last Dose Status Informant  ?atorvastatin (LIPITOR) 10 MG tablet [id number] Yes Take 1 tablet (10 mg total) by mouth daily. Wendie Agreste, MD Taking Active   ?blood glucose meter kit and supplies [id number] Yes Dispense based on patient and insurance preference. Use once per day. Wendie Agreste, MD Taking Active   ?Cholecalciferol (VITAMIN D3) 250 MCG (10000 UT) TABS [id number] Yes Take 1 tablet by mouth daily. [provider] Taking Active   ?clotrimazole (LOTRIMIN) 1 % cream [id number] Yes Apply 1 application topically 2 (two) times daily. As needed for genital rash. Wendie Agreste, MD Taking Active   ?docusate sodium (COLACE) 100 MG capsule [id number] Yes Take 100 mg by mouth 2 (two) times daily. [provider] Taking Active   ?gabapentin (NEURONTIN) 100 MG capsule [id number] No Take 1 capsule (100 mg total) by mouth at bedtime.  ?Patient not taking: Reported on 07/05/2021  ? Wendie Agreste, MD Not Taking Active   ?glucose blood test strip [id number] Yes Use as instructed once per day Wendie Agreste, MD Taking Active   ?Lancets (FREESTYLE) lancets [id number] Yes Use as instructed once per day. Wendie Agreste, MD Taking Active   ?lisinopril (ZESTRIL) 10 MG tablet [id number] Yes Take 1 tablet (10 mg total) by mouth daily. Wendie Agreste, MD Taking Active   ?pantoprazole (PROTONIX) 40 MG tablet [id number] Yes Take 1 tablet (40 mg total) by mouth daily. Brunetta Jeans, PA-C Taking Active   ? ?  ?  ? ?  ? ? ?Patient Active Problem List  ? Diagnosis Date Noted  ? New onset type 2 diabetes mellitus ([place]) 05/20/2018  ? Anxiety state 05/13/2018  ? Diastasis recti 05/13/2018  ? Benign essential hypertension 11/16/2013  ? OBESITY 07/25/2009  ? [facility] 12/15/2008  ? ASBESTOS EXPOSURE 08/11/2008  ? ESOPHAGEAL STRICTURE 03/01/2008  ? GERD 03/01/2008  ? HIATAL HERNIA 03/01/2008  ? ? ?Immunization History  ?Administered  Date(s) Administered  ? Tdap 01/02/2019  ? ? ?Conditions to be addressed/monitored: ?HTN, HLD, Hypertriglyceridemia, and DMII ? ?Care Plan : ccm pharmacy care plan  ?Updates made by Edythe Clarity, RPH since 07/05/2021 12:00 AM  ?  ? ?Problem: HTN, HLD, Hypertriglyceridemia, and  DMII   ?  ? ?Long-Range Goal: Disease Management   ?Start Date: 01/05/2021  ?Expected End Date: 01/05/2022  ?Recent Progress: On track  ?Priority: High  ?Note:   ?Current Barriers:  ?Unable to achieve control of DMII, HLD  ? ?Pharmacist Clinical Goal(s):  ?Patient will verbalize ability to afford treatment regimen ?contact provider office for questions/concerns as evidenced notation of same in electronic health record through collaboration with PharmD and provider.  ? ?Interventions: ?1:1 collaboration with Wendie Agreste, MD regarding development and update of comprehensive plan of care as evidenced by provider attestation and co-signature ?Inter-disciplinary care team collaboration (see longitudinal plan of care) ?Comprehensive medication review performed; medication list updated in electronic medical record ? ? ?Hyperlipidemia: (LDL goal < 70) ?-Uncontrolled ?-Current treatment: ?Atorvastatin 77m Appropriate, Query effective, ?-Medications previously tried: none noted  ?-Current dietary patterns: denies any sugars, fatty foods ?-Current exercise habits: plans to increase exercise after hip recoer ?-Educated on Cholesterol goals;  ?Benefits of statin for ASCVD risk reduction; ?Importance of limiting foods high in cholesterol; ?Has upcoming OV with Dr. GCarlota Raspberry  He has been adherent with statin since most recent OV where LDL and total cholesterol were severely elevated. ?-Recommended to continue current medication ?Recheck LDL - can increase to higher intensity statin if still elevated ? ?Hypertension (BP goal <130/80) ?-Controlled ?-Current treatment: ?Lisinopril 10 mg once daily  ?-Medications previously tried: n/a  ?-Current home  readings: reports <130/80 ?-Denies hypotensive/hypertensive symptoms ?-Educated on BP goals and benefits of medications for prevention of heart attack, stroke and kidney damage; ?Importance of home blood pressure mo

## 2021-07-05 ENCOUNTER — Ambulatory Visit (INDEPENDENT_AMBULATORY_CARE_PROVIDER_SITE_OTHER): Payer: Medicare HMO | Admitting: Pharmacist

## 2021-07-05 DIAGNOSIS — E1165 Type 2 diabetes mellitus with hyperglycemia: Secondary | ICD-10-CM

## 2021-07-05 DIAGNOSIS — E1169 Type 2 diabetes mellitus with other specified complication: Secondary | ICD-10-CM

## 2021-07-05 NOTE — Patient Instructions (Addendum)
Visit Information ? ? Goals Addressed   ? ?  ?  ?  ?  ? This Visit's Progress  ?  Improve LDL     ?  Timeframe:  Long-Range Goal ?Priority:  High ?Start Date: 07/05/21                            ?Expected End Date:  01/06/22                    ? ?Follow Up Date 10/06/21  ?  ?Continue to work towards improving LDL to goal < 70.  ?  ?Why is this important?   ?These steps will help you keep on track with your medicines. ?  ?Notes:  ?  ? ?  ? ?Patient Care Plan: [facility] pharmacy care plan  ?  ? ?Problem Identified: HTN, HLD, Hypertriglyceridemia, and DMII   ?  ? ?Long-Range Goal: Disease Management   ?Start Date: 01/05/2021  ?Expected End Date: 01/05/2022  ?Recent Progress: On track  ?Priority: High  ?Note:   ?Current Barriers:  ?Unable to achieve control of DMII, HLD  ? ?Pharmacist Clinical Goal(s):  ?Patient will verbalize ability to afford treatment regimen ?contact provider office for questions/concerns as evidenced notation of same in electronic health record through collaboration with PharmD and provider.  ? ?Interventions: ?1:1 collaboration with Wendie Agreste, MD regarding development and update of comprehensive plan of care as evidenced by provider attestation and co-signature ?Inter-disciplinary care team collaboration (see longitudinal plan of care) ?Comprehensive medication review performed; medication list updated in electronic medical record ? ? ?Hyperlipidemia: (LDL goal < 70) ?-Uncontrolled ?-Current treatment: ?Atorvastatin '10mg'$  Appropriate, Query effective, ?-Medications previously tried: none noted  ?-Current dietary patterns: denies any sugars, fatty foods ?-Current exercise habits: plans to increase exercise after hip recoer ?-Educated on Cholesterol goals;  ?Benefits of statin for ASCVD risk reduction; ?Importance of limiting foods high in cholesterol; ?Has upcoming OV with Dr. Carlota Raspberry.  He has been adherent with statin since most recent OV where LDL and total cholesterol were severely  elevated. ?-Recommended to continue current medication ?Recheck LDL - can increase to higher intensity statin if still elevated ? ?Hypertension (BP goal <130/80) ?-Controlled ?-Current treatment: ?Lisinopril 10 mg once daily  ?-Medications previously tried: n/a  ?-Current home readings: reports <130/80 ?-Denies hypotensive/hypertensive symptoms ?-Educated on BP goals and benefits of medications for prevention of heart attack, stroke and kidney damage; ?Importance of home blood pressure monitoring; ?-Counseled to monitor BP at home 1-2x/wk, document, and provide log at future appointments ?-Recommended continue current medication ? ?120s/80s, denies dizziness at home ? ?Diabetes (A1c goal <7%) ?-Not ideally controlled ?-Concerned with kidney issues and metformin.  ?Expressed some concern with statins and a friend that had muscle pain while taking lipitor. ?-Current medications: ?No current medications  ?-Medications previously tried: none  ?-Current home glucose readings ?fasting glucose: not testing ?post prandial glucose: not testing ?-Denies hypoglycemic/hyperglycemic symptoms ?-Current meal patterns: tries to follow low carb diet. Bran cereal for breakfast. Has made numerous dietary changes, and done research on foods and how they can impact dm.  ?-Reviewed potential side effects and role of patient monitoring/reporting of side effects   ?-Current exercise: has gotten back to [employer] in [place].  ?-Educated on A1c and blood sugar goals; ?Complications of diabetes including kidney damage, retinal damage, and cardiovascular disease; ?Benefits of routine self-monitoring of blood sugar; ?-Counseled to check feet daily  and get yearly eye exams ?-Recommended for patient to consider starting metformin and agreed waiting for next labs would help guide next steps for statin and metformin use  ? ?Update 07/05/21 ?Most recent A1c 7.1 ?Not monitoring glucose at home.  He is watching his dietary choices  and denies any sweets/sugars.  Plans to increase exercise after hip recovery.  Still would recommend possible starting metformin if A1c remains above 7.0 just to prevent it from increasing and causing issues down the road. ?Continue to work hard on diet/exercise. ?No changes at this time continue to monitor. ? ?Patient Goals/Self-Care Activities ?Patient will:  ?- target a minimum of 150 minutes of moderate intensity exercise weekly ? ?Follow Up Plan: [facility] team will follow up with the patient and will provide direct communication to the PCP for this patient.  ? ?Medication Assistance: None required.  Patient affirms current coverage meets needs. ? ?Patient's preferred pharmacy is: ? ?[facility], [place] [address] ?Phone: [phone] Fax: [phone] ? ?Pt endorses 100% compliance ? ?Follow Up:  Patient agrees to Care Plan and Follow-up. ? ?Plan: F/u with pharmacist 6 months. 3 month DM call to see if patient has any home readings to provide ?  ? ?  ?  ? ?The patient verbalized understanding of instructions, educational materials, and care plan provided today and declined offer to receive copy of patient instructions, educational materials, and care plan.  ?Telephone follow up appointment with pharmacy team member scheduled for: 6 months ? ?Edythe Clarity, [facility]  ?Beverly Milch, PharmD ?Clinical Pharmacist  ?[facility] ?([phone] ?

## 2021-07-06 ENCOUNTER — Other Ambulatory Visit: Payer: Self-pay

## 2021-07-06 ENCOUNTER — Ambulatory Visit: Payer: Medicare HMO | Admitting: Family Medicine

## 2021-07-06 ENCOUNTER — Encounter: Payer: Self-pay | Admitting: Family Medicine

## 2021-07-06 ENCOUNTER — Ambulatory Visit (INDEPENDENT_AMBULATORY_CARE_PROVIDER_SITE_OTHER): Payer: Medicare HMO | Admitting: Family Medicine

## 2021-07-06 VITALS — BP 132/74 | HR 75 | Temp 98.0°F | Ht 69.0 in | Wt 219.2 lb

## 2021-07-06 DIAGNOSIS — E1169 Type 2 diabetes mellitus with other specified complication: Secondary | ICD-10-CM

## 2021-07-06 DIAGNOSIS — G629 Polyneuropathy, unspecified: Secondary | ICD-10-CM

## 2021-07-06 DIAGNOSIS — E785 Hyperlipidemia, unspecified: Secondary | ICD-10-CM

## 2021-07-06 LAB — COMPREHENSIVE METABOLIC PANEL
ALT: 26 U/L (ref 0–53)
AST: 25 U/L (ref 0–37)
Albumin: 4.5 g/dL (ref 3.5–5.2)
Alkaline Phosphatase: 78 U/L (ref 39–117)
BUN: 13 mg/dL (ref 6–23)
CO2: 28 mEq/L (ref 19–32)
Calcium: 9.5 mg/dL (ref 8.4–10.5)
Chloride: 101 mEq/L (ref 96–112)
Creatinine, Ser: 0.93 mg/dL (ref 0.40–1.50)
GFR: 85.14 mL/min (ref >60.00)
Glucose, Bld: 132 mg/dL — ABNORMAL HIGH (ref 70–99)
Potassium: 4.6 mEq/L (ref 3.5–5.1)
Sodium: 138 mEq/L (ref 135–145)
Total Bilirubin: 0.5 mg/dL (ref 0.2–1.2)
Total Protein: 6.7 g/dL (ref 6.0–8.3)

## 2021-07-06 LAB — LIPID PANEL
Cholesterol: 260 mg/dL — ABNORMAL HIGH (ref 0–200)
HDL: 38.3 mg/dL — ABNORMAL LOW (ref >39.00)
NonHDL: 221.99
Total CHOL/HDL Ratio: 7
Triglycerides: 292 mg/dL — ABNORMAL HIGH (ref 0.0–149.0)
VLDL: 58.4 mg/dL — ABNORMAL HIGH (ref 0.0–40.0)

## 2021-07-06 LAB — LDL CHOLESTEROL, DIRECT: Direct LDL: 172 mg/dL

## 2021-07-06 MED ORDER — ATORVASTATIN CALCIUM 10 MG PO TABS: 10.0000 mg | ORAL_TABLET | Freq: Every day | ORAL | 1 refills | Status: DC

## 2021-07-06 MED ORDER — GABAPENTIN 300 MG PO CAPS
300.0000 mg | ORAL_CAPSULE | Freq: Every evening | ORAL | 1 refills | Status: DC | PRN
Start: 2021-07-06 — End: 2021-09-27

## 2021-07-06 NOTE — Progress Notes (Signed)
? ?Subjective:  ?Patient ID: David Dalton, male    DOB: 1955/05/17  Age: 66 y.o. MRN: [id number] ? ?CC:  ?Chief Complaint  ?Patient presents with  ? Hyperlipidemia  ?  Is doing well on medication, no concerns at this time, patient is fasting today   ? ? ?HPI ?David Dalton presents for  ? ?Hyperlipidemia: ?With diabetes.  ?Chronic care management appointment yesterday.  Tolerating statin.  No new myalgias.  ?Started on Lipitor 10 mg 05/25/2021. ? ?Lab Results  ?Component Value Date  ? CHOL 315 (H) 04/20/2021  ? HDL 39.00 (L) 04/20/2021  ? LDLDIRECT 197.0 04/20/2021  ? TRIG 390.0 (H) 04/20/2021  ? CHOLHDL 8 04/20/2021  ? ?Lab Results  ?Component Value Date  ? ALT 27 04/20/2021  ? AST 24 04/20/2021  ? ALKPHOS 90 04/20/2021  ? BILITOT 0.6 04/20/2021  ? ? ?Diabetes: ?With hyperglycemia, peripheral neuropathy with possible back source versus diabetes.  Treated with gabapentin 135m at bedtime with option to increase - 100 and 2031mnot helping foot issues, but 30057moes work. he has taken 300m33mrm his neighbor that worked well. Some HA on side of head only after taking the 100mg37m200mg 38meeded for foot pains. Only needing 1-2 times per week.  ?Diet controlled.  On lisinopril for ACE inhibitor. ? ?Lab Results  ?Component Value Date  ? HGBA1C 7.1 (A) 04/20/2021  ? HGBA1C 7.8 (H) 08/15/2020  ? HGBA1C 7.2 (H) 09/17/2019  ? ?Lab Results  ?Component Value Date  ? MICROALBUR 1.2 08/15/2020  ? CREATININE 0.89 04/20/2021  ? ? ?History ?Patient Active Problem List  ? Diagnosis Date Noted  ? New onset type 2 diabetes mellitus (HCC) [place]/2020  ? Anxiety state 05/13/2018  ? Diastasis recti 05/13/2018  ? Benign essential hypertension 11/16/2013  ? OBESITY 07/25/2009  ? [facility]  ? ASBESTOS EXPOSURE 08/11/2008  ? ESOPHAGEAL STRICTURE 03/01/2008  ? GERD 03/01/2008  ? HIATAL HERNIA 03/01/2008  ? ?Past Medical History:  ?Diagnosis Date  ? Anxiety   ? History of chickenpox   ? Hypertension   ? ?Past  Surgical History:  ?Procedure Laterality Date  ? FINGER SURGERY    ? ?No Known Allergies ?Prior to Admission medications   ?Medication Sig Start Date End Date Taking? Authorizing Provider  ?atorvastatin (LIPITOR) 10 MG tablet Take 1 tablet (10 mg total) by mouth daily. 05/25/21  Yes GreeneWendie Agreste?Cholecalciferol (VITAMIN D3) 250 MCG (10000 UT) TABS Take 1 tablet by mouth daily.   Yes [provider]  ?clotrimazole (LOTRIMIN) 1 % cream Apply 1 application topically 2 (two) times daily. As needed for genital rash. 05/25/21  Yes GreeneWendie Agreste?docusate sodium (COLACE) 100 MG capsule Take 100 mg by mouth 2 (two) times daily.   Yes [provider]  ?lisinopril (ZESTRIL) 10 MG tablet Take 1 tablet (10 mg total) by mouth daily. 04/20/21  Yes GreeneWendie Agreste?blood glucose meter kit and supplies Dispense based on patient and insurance preference. Use once per day. ?Patient not taking: Reported on 07/06/2021 09/15/20   GreeneWendie Agreste?gabapentin (NEURONTIN) 100 MG capsule Take 1 capsule (100 mg total) by mouth at bedtime. ?Patient not taking: Reported on 07/05/2021 04/20/21   GreeneWendie Agreste?glucose blood test strip Use as instructed once per day ?Patient not taking: Reported on 07/06/2021 09/20/20   GreeneWendie Agreste?Lancets (FREESTYLE) lancets Use as instructed once per  day. ?Patient not taking: Reported on 07/06/2021 09/20/20   Wendie Agreste, MD  ?pantoprazole (PROTONIX) 40 MG tablet Take 1 tablet (40 mg total) by mouth daily. ?Patient not taking: Reported on 07/06/2021 09/17/19   Brunetta Jeans, PA-C  ? ?Social History  ? ?Socioeconomic History  ? Marital status: Married  ?  Spouse name: Not on file  ? Number of children: Not on file  ? Years of education: Not on file  ? Highest education level: Not on file  ?Occupational History  ? Not on file  ?Tobacco Use  ? Smoking status: Never  ? Smokeless tobacco: Never  ?Vaping Use  ? Vaping Use: Never used  ?Substance and  Sexual Activity  ? Alcohol use: Not Currently  ? Drug use: Not Currently  ? Sexual activity: Yes  ?Other Topics Concern  ? Not on file  ?Social History Narrative  ? Not on file  ? ?Social Determinants of Health  ? ?Financial Resource Strain: Low Risk   ? Difficulty of Paying Living Expenses: Not hard at all  ?Food Insecurity: No Food Insecurity  ? Worried About Charity fundraiser in the Last Year: Never true  ? Ran Out of Food in the Last Year: Never true  ?Transportation Needs: No Transportation Needs  ? Lack of Transportation (Medical): No  ? Lack of Transportation (Non-Medical): No  ?Physical Activity: Insufficiently Active  ? Days of Exercise per Week: 3 days  ? Minutes of Exercise per Session: 40 min  ?Stress: No Stress Concern Present  ? Feeling of Stress : Not at all  ?Social Connections: Socially Isolated  ? Frequency of Communication with Friends and Family: More than three times a week  ? Frequency of Social Gatherings with Friends and Family: More than three times a week  ? Attends Religious Services: Never  ? Active Member of Clubs or Organizations: No  ? Attends Archivist Meetings: Never  ? Marital Status: Separated  ?Intimate Partner Violence: Not At Risk  ? Fear of Current or Ex-Partner: No  ? Emotionally Abused: No  ? Physically Abused: No  ? Sexually Abused: No  ? ? ?Review of Systems ?Per HPI.  ? ?Objective:  ? ?Vitals:  ? 07/06/21 1021  ?BP: 132/74  ?Pulse: 75  ?Temp: 98 ?F (36.7 ?C)  ?TempSrc: Temporal  ?SpO2: 97%  ?Weight: 219 lb 3.2 oz (99.4 kg)  ?Height: _0  (1.753 m)  ? ? ? ?Physical Exam ?Constitutional:   ?   General: He is not in acute distress. ?   Appearance: Normal appearance. He is well-developed.  ?HENT:  ?   Head: Normocephalic and atraumatic.  ?Cardiovascular:  ?   Rate and Rhythm: Normal rate.  ?Pulmonary:  ?   Effort: Pulmonary effort is normal.  ?Musculoskeletal:  ?   Right lower leg: No edema.  ?   Left lower leg: No edema.  ?   Comments: Some right hip discomfort  with seated straight leg raise on the right, no radiating symptoms.  Left seated leg raise negative.  ?Skin: ?   General: Skin is warm and dry.  ?   Comments: Scalp, left head nontender.   ?Neurological:  ?   Mental Status: He is alert and oriented to person, place, and time.  ?Psychiatric:     ?   Mood and Affect: Mood normal.  ? ? ? ? ? ?Assessment & Plan:  ?David Dalton is a 66 y.o. male . ?Hyperlipidemia associated with type  2 diabetes mellitus ([place]) - Plan: Lipid panel, Comprehensive metabolic panel, atorvastatin (LIPITOR) 10 MG tablet ? -Tolerating Lipitor, continue same dose, check updated labs. ? ?Neuropathy - Plan: gabapentin (NEURONTIN) 300 MG capsule ? -Improved when he tried a 300 mg dose, potential side effects with 100, 200 mg as above.  Will prescribe 300 mg for intermittent use, RTC precautions if any return of headache or new side effects, RTC precautions if insufficient treatment with current regimen.   ? ?4 months recheck ? ? ? ?Meds ordered this encounter  ?Medications  ? atorvastatin (LIPITOR) 10 MG tablet  ?  Sig: Take 1 tablet (10 mg total) by mouth daily.  ?  Dispense:  90 tablet  ?  Refill:  1  ? gabapentin (NEURONTIN) 300 MG capsule  ?  Sig: Take 1 capsule (300 mg total) by mouth at bedtime as needed.  ?  Dispense:  45 capsule  ?  Refill:  1  ? ?Patient Instructions  ?We can try the 315m gabapentin dose as needed. Recheck in 4 months for diabetes. Let me know if there are questions sooner. No other changes for now. Good luck.  ? ? ? ? ?Signed,  ? ?JMerri Ray MD ?[facility]?07/06/21 ?10:58 AM ? ? ?

## 2021-07-06 NOTE — Patient Instructions (Addendum)
We can try the '300mg'$  gabapentin dose as needed. Recheck in 4 months for diabetes. Let me know if there are questions sooner. No other changes for now. Good luck.  ? ?

## 2021-07-10 ENCOUNTER — Ambulatory Visit: Payer: Medicare HMO | Admitting: Cardiology

## 2021-07-10 ENCOUNTER — Other Ambulatory Visit: Payer: Self-pay

## 2021-07-10 ENCOUNTER — Encounter: Payer: Self-pay | Admitting: Cardiology

## 2021-07-10 VITALS — BP 146/84 | HR 76 | Ht 68.0 in | Wt 219.0 lb

## 2021-07-10 DIAGNOSIS — E119 Type 2 diabetes mellitus without complications: Secondary | ICD-10-CM

## 2021-07-10 DIAGNOSIS — E782 Mixed hyperlipidemia: Secondary | ICD-10-CM | POA: Diagnosis not present

## 2021-07-10 DIAGNOSIS — E785 Hyperlipidemia, unspecified: Secondary | ICD-10-CM | POA: Diagnosis not present

## 2021-07-10 DIAGNOSIS — R9431 Abnormal electrocardiogram [ECG] [EKG]: Secondary | ICD-10-CM | POA: Diagnosis not present

## 2021-07-10 DIAGNOSIS — Z01810 Encounter for preprocedural cardiovascular examination: Secondary | ICD-10-CM | POA: Insufficient documentation

## 2021-07-10 NOTE — Assessment & Plan Note (Addendum)
We will check a coronary calcium score, $99 test, given his diabetes to see if we need to intensify therapy, i.e. change to high intensity statin.  He was concerned about potential leg pain with this medication, provided reassurance that the vast majority of people on statins have no discomfort.  If there is discomfort, a statin holiday will tell us if it is coming from the medication.  He was appreciative. ?

## 2021-07-10 NOTE — Patient Instructions (Signed)
Medication Instructions:  ?Your physician recommends that you continue on your current medications as directed. Please refer to the Current Medication list given to you today. ? ? ?Labwork: ?None ? ? ?Testing/Procedures: ?Your physician has requested that you have an echocardiogram. Echocardiography is a painless test that uses sound waves to create images of your heart. It provides your doctor with information about the size and shape of your heart and how well your heart?s chambers and valves are working. This procedure takes approximately one hour. There are no restrictions for this procedure. ? ? ?Schedule coronary calcium score. ? ?Follow-Up: ?To be determined ? ?Any Other Special Instructions Will Be Listed Below (If Applicable). ? ? ? ? ?Thank you for choosing [facility] ! ? ? ? ? ? ? ?

## 2021-07-10 NOTE — Assessment & Plan Note (Addendum)
Right hip surgery.  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  The echocardiogram will show [place] if there is in fact an old infarct, i.e. wall motion abnormality present.  I will also check a coronary calcium score to see if there is any evidence of calcified plaque.  If there is, we will change him to Crestor 20 mg a day.  I would also encourage aspirin 81 mg if there is plaque present. ?

## 2021-07-10 NOTE — Progress Notes (Signed)
?Cardiology Office Note:   ? ?Date:  07/10/2021  ? ?ID:  NIKLAUS MAMARIL, DOB 05-25-1955, MRN [id number] ? ?PCP:  Wendie Agreste, MD ?  ?[facility] Providers ?Cardiologist:  None    ? ?Referring MD: Wendie Agreste, MD  ? ? ?History of Present Illness:   ? ?David Dalton is a 66 y.o. male here for the evaluation of abnormal EKG at the request of Dr. Merri Ray. ?Currently being treated for hyperlipidemia with type 2 diabetes-Lipitor as well as neuropathy with gabapentin. ? ?Right hip pain. Getting up to truck is very challenging.  Otherwise on flat land he is not having any difficulties, he can achieve greater than 4 METS of activity without anginal symptoms. ? ?An EKG was performed on 05/25/2021 which showed left anterior fascicular block as well as poor R wave progression and nonspecific T wave changes.  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 ? ?He states that he was born 24 months early and was 1 pound.  He was born at home.  The doctor at the time did not fill out a birth certificate because he did not believe that he would be alive very long.  He ended up getting a birth certificate around age 53 when he was getting a driver's license. ? ?He never smoked, never drank.  Enjoys [employer] but unable to ride them because of his hip pain at this time. ? ?Past Medical History:  ?Diagnosis Date  ? Anxiety   ? History of chickenpox   ? Hypertension   ? ? ?Past Surgical History:  ?Procedure Laterality Date  ? FINGER SURGERY    ? ? ?Current Medications: ?Current Meds  ?Medication Sig  ? atorvastatin (LIPITOR) 10 MG tablet Take 1 tablet (10 mg total) by mouth daily.  ? Cholecalciferol (VITAMIN D3) 250 MCG (10000 UT) TABS Take 1 tablet by mouth daily.  ? clotrimazole (LOTRIMIN) 1 % cream Apply 1 application topically 2 (two) times daily. As needed for genital rash.  ? docusate sodium (COLACE) 100 MG capsule Take 100 mg by mouth 2 (two) times daily.  ? gabapentin  (NEURONTIN) 300 MG capsule Take 1 capsule (300 mg total) by mouth at bedtime as needed.  ? lisinopril (ZESTRIL) 10 MG tablet Take 1 tablet (10 mg total) by mouth daily.  ?  ? ?Allergies:   Patient has no known allergies.  ? ?Social History  ? ?Socioeconomic History  ? Marital status: Married  ?  Spouse name: Not on file  ? Number of children: Not on file  ? Years of education: Not on file  ? Highest education level: Not on file  ?Occupational History  ? Not on file  ?Tobacco Use  ? Smoking status: Never  ? Smokeless tobacco: Never  ?Vaping Use  ? Vaping Use: Never used  ?Substance and Sexual Activity  ? Alcohol use: Not Currently  ? Drug use: Not Currently  ? Sexual activity: Yes  ?Other Topics Concern  ? Not on file  ?Social History Narrative  ? Not on file  ? ?Social Determinants of Health  ? ?Financial Resource Strain: Low Risk   ? Difficulty of Paying Living Expenses: Not hard at all  ?Food Insecurity: No Food Insecurity  ? Worried About Charity fundraiser in the Last Year: Never true  ? Ran Out of Food in the Last Year: Never true  ?Transportation Needs: No Transportation Needs  ? Lack of Transportation (Medical): No  ?  Lack of Transportation (Non-Medical): No  ?Physical Activity: Insufficiently Active  ? Days of Exercise per Week: 3 days  ? Minutes of Exercise per Session: 40 min  ?Stress: No Stress Concern Present  ? Feeling of Stress : Not at all  ?Social Connections: Socially Isolated  ? Frequency of Communication with Friends and Family: More than three times a week  ? Frequency of Social Gatherings with Friends and Family: More than three times a week  ? Attends Religious Services: Never  ? Active Member of Clubs or Organizations: No  ? Attends Archivist Meetings: Never  ? Marital Status: Separated  ?  ? ?Family History: ?The patient's family history includes Alcohol abuse in his father; Anxiety disorder in his mother; Cancer in his mother, sister, sister, and sister; Diabetes in his brother  and mother; Heart attack (age of onset: 6) in his father; Heart disease in his father. ?Father 12 died MI ?Brother 51 MI (Drugs and ETOH) ? ?ROS:   ?Please see the history of present illness.    ? All other systems reviewed and are negative. ? ?EKGs/Labs/Other Studies Reviewed:   ? ?The following studies were reviewed today: ?Prior office note, EKG, lab work reviewed ? ?EKG:  EKG is  ordered today.  The ekg ordered today demonstrates as above ? ?Recent Labs: ?05/25/2021: Hemoglobin 15.6; Platelets 311.0 ?07/06/2021: ALT 26; BUN 13; Creatinine, Ser 0.93; Potassium 4.6; Sodium 138  ?Recent Lipid Panel ?   ?Component Value Date/Time  ? CHOL 260 (H) 07/06/2021 1105  ? TRIG 292.0 (H) 07/06/2021 1105  ? HDL 38.30 (L) 07/06/2021 1105  ? CHOLHDL 7 07/06/2021 1105  ? VLDL 58.4 (H) 07/06/2021 1105  ? LDLDIRECT 172.0 07/06/2021 1105  ? ? ? ?Risk Assessment/Calculations:   ? ?CT of chest and 2010 personally reviewed and interpreted does not show any indication of coronary calcium.  Note this was 13 years ago however. ? ?    ? ?   ? ?Physical Exam:   ? ?VS:  BP (!) 146/84   Pulse 76   Ht '5\' 8"'$  (1.727 m)   Wt 219 lb (99.3 kg)   SpO2 96%   BMI 33.30 kg/m?    ? ?Wt Readings from Last 3 Encounters:  ?07/10/21 219 lb (99.3 kg)  ?07/06/21 219 lb 3.2 oz (99.4 kg)  ?05/25/21 219 lb 9.6 oz (99.6 kg)  ?  ? ?GEN:  Well nourished, well developed in no acute distress ?HEENT: Normal ?NECK: No JVD; No carotid bruits ?LYMPHATICS: No lymphadenopathy ?CARDIAC: RRR, no murmurs, no rubs, gallops ?RESPIRATORY:  Clear to auscultation without rales, wheezing or rhonchi  ?ABDOMEN: Soft, non-tender, non-distended ?MUSCULOSKELETAL:  No edema; No deformity  ?SKIN: Warm and dry ?NEUROLOGIC:  Alert and oriented x 3 ?PSYCHIATRIC:  Normal affect  ? ?ASSESSMENT:   ? ?1. Hyperlipidemia, unspecified hyperlipidemia type   ?2. Nonspecific abnormal electrocardiogram (ECG) (EKG)   ?3. New onset type 2 diabetes mellitus ([place])   ?4. Preop cardiovascular exam   ?5.  Mixed hyperlipidemia   ? ?PLAN:   ? ?In order of problems listed above: ? ?Nonspecific abnormal electrocardiogram (ECG) (EKG) ?We will go ahead and check an echocardiogram to ensure proper structure and function of his heart. ? ?New onset type 2 diabetes mellitus ([place]) ?We will check a coronary calcium score, $99 test, given his diabetes to see if we need to intensify therapy, i.e. change to high intensity statin.  He was concerned about potential leg pain with this medication, provided  reassurance that the vast majority of people on statins have no discomfort.  If there is discomfort, a statin holiday will tell us if it is coming from the medication.  He was appreciative. ? ?Preop cardiovascular exam ?Right hip surgery.  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  The echocardiogram will show [place] if there is in fact an old infarct, i.e. wall motion abnormality present.  I will also check a coronary calcium score to see if there is any evidence of calcified plaque.  If there is, we will change him to Crestor 20 mg a day.  I would also encourage aspirin 81 mg if there is plaque present. ? ?Mixed hyperlipidemia ?LDL in the 170s to 190s.  Agree with statin therapy.  Coronary calcium score will help guide [place] on intensity. ?  ? ? ?  ? ? ?Medication Adjustments/Labs and Tests Ordered: ?Current medicines are reviewed at length with the patient today.  Concerns regarding medicines are outlined above.  ?Orders Placed This Encounter  ?Procedures  ? CT CARDIAC SCORING (SELF PAY ONLY)  ? ECHOCARDIOGRAM COMPLETE  ? ?No orders of the defined types were placed in this encounter. ? ? ?Patient Instructions  ?Medication Instructions:  ?Your physician recommends that you continue on your current medications as directed. Please refer to the Current Medication list given to you  today. ? ? ?Labwork: ?None ? ? ?Testing/Procedures: ?Your physician has requested that you have an echocardiogram. Echocardiography is a painless test that uses sound waves to create images of your heart. It provides your doctor with information about the s

## 2021-07-10 NOTE — Assessment & Plan Note (Signed)
LDL in the 170s to 190s.  Agree with statin therapy.  Coronary calcium score will help guide [place] on intensity. ?

## 2021-07-10 NOTE — Assessment & Plan Note (Signed)
We will go ahead and check an echocardiogram to ensure proper structure and function of his heart. ?

## 2021-07-11 ENCOUNTER — Other Ambulatory Visit: Payer: Self-pay | Admitting: Family Medicine

## 2021-07-11 DIAGNOSIS — I10 Essential (primary) hypertension: Secondary | ICD-10-CM

## 2021-07-11 DIAGNOSIS — E1169 Type 2 diabetes mellitus with other specified complication: Secondary | ICD-10-CM

## 2021-07-11 NOTE — Progress Notes (Signed)
See labs 

## 2021-07-11 NOTE — Addendum Note (Signed)
Addended by: Christella Scheuermann C on: 07/11/2021 11:21 AM ? ? Modules accepted: Orders ? ?

## 2021-07-12 ENCOUNTER — Telehealth: Payer: Self-pay

## 2021-07-12 NOTE — Telephone Encounter (Signed)
-----   Message from Wendie Agreste, MD sent at 07/11/2021 12:35 PM EDT ----- ?Call patient ?Cholesterol levels were improved from few months ago.  Still elevated cholesterol and triglycerides, as well as LDL 172.  Need to try to get that closer to 100 or below.  Blood sugar still slightly elevated.  Other electrolytes were stable.  Based on minimal changes of cholesterol recommend increasing Lipitor to 20 mg/day for now and if tolerated may need to increase further.  Recheck labs again in 6 weeks but let me know if any new side effects with higher dose (sent to pharmacy).  ?

## 2021-07-12 NOTE — Telephone Encounter (Signed)
Pt has been informed but is talking with cardiology and possibly starting crestor  so pt has declined to increase at this time and would like to wait until Echo and calcium scoring are done  ?

## 2021-07-13 ENCOUNTER — Ambulatory Visit (INDEPENDENT_AMBULATORY_CARE_PROVIDER_SITE_OTHER): Payer: Medicare HMO

## 2021-07-13 DIAGNOSIS — R9431 Abnormal electrocardiogram [ECG] [EKG]: Secondary | ICD-10-CM

## 2021-07-14 DIAGNOSIS — E785 Hyperlipidemia, unspecified: Secondary | ICD-10-CM | POA: Diagnosis not present

## 2021-07-14 DIAGNOSIS — I10 Essential (primary) hypertension: Secondary | ICD-10-CM | POA: Diagnosis not present

## 2021-07-14 DIAGNOSIS — E1159 Type 2 diabetes mellitus with other circulatory complications: Secondary | ICD-10-CM | POA: Diagnosis not present

## 2021-07-14 LAB — ECHOCARDIOGRAM COMPLETE
AR max vel: 3.47 cm2
AV Area VTI: 4.89 cm2
AV Area mean vel: 3.61 cm2
AV Mean grad: 2 mmHg
AV Peak grad: 5.2 mmHg
Ao pk vel: 1.14 m/s
Area-P 1/2: 3.76 cm2
Calc EF: 57.2 %
S' Lateral: 4 cm
Single Plane A2C EF: 58.8 %
Single Plane A4C EF: 58.9 %

## 2021-07-17 ENCOUNTER — Telehealth: Payer: Self-pay

## 2021-07-17 NOTE — Telephone Encounter (Signed)
Patient notified and verbalized understanding of results. Pt had no questions or concerns at this time.  

## 2021-07-17 NOTE — Telephone Encounter (Signed)
-----   Message from Jerline Pain, MD sent at 07/17/2021  8:31 AM EDT ----- ?Normal pump function of heart.  No evidence of wall motion abnormality.  Overall reassuring.  He may proceed with hip surgery. ?Candee Furbish, MD ? ?

## 2021-07-18 ENCOUNTER — Ambulatory Visit (INDEPENDENT_AMBULATORY_CARE_PROVIDER_SITE_OTHER): Payer: Medicare HMO | Admitting: Family Medicine

## 2021-07-18 ENCOUNTER — Ambulatory Visit (HOSPITAL_BASED_OUTPATIENT_CLINIC_OR_DEPARTMENT_OTHER)
Admission: RE | Admit: 2021-07-18 | Discharge: 2021-07-18 | Disposition: A | Discharge status: Home / Self Care | Payer: Medicare HMO | Source: Ambulatory Visit | Attending: Cardiology | Admitting: Cardiology

## 2021-07-18 ENCOUNTER — Encounter: Payer: Self-pay | Admitting: Family Medicine

## 2021-07-18 VITALS — BP 134/80 | HR 65 | Temp 97.6°F | Resp 16 | Ht 68.0 in | Wt 217.4 lb

## 2021-07-18 DIAGNOSIS — R21 Rash and other nonspecific skin eruption: Secondary | ICD-10-CM

## 2021-07-18 DIAGNOSIS — E119 Type 2 diabetes mellitus without complications: Secondary | ICD-10-CM | POA: Insufficient documentation

## 2021-07-18 DIAGNOSIS — E785 Hyperlipidemia, unspecified: Secondary | ICD-10-CM | POA: Insufficient documentation

## 2021-07-18 NOTE — Patient Instructions (Signed)
Follow up w/ Dr Carlota Raspberry as needed or as scheduled ?The area currently looks fungal free and you are good to go for surgery ?Continue to use the Clotrimazole cream as needed when you notice irritation/inflammation ?Call with any questions or concerns ?Good luck with your hip!! ?

## 2021-07-18 NOTE — Progress Notes (Signed)
? ?  Subjective:  ? ? Patient ID: David Dalton, male    DOB: Mar 06, 1956, 66 y.o.   MRN: [id number] ? ?HPI ?Rash- pt has hx of fungal rash in groin/genitalia that he has been using Clotrimazole cream for.  Pt is uncircumcised.  He is aware that he needs to keep area clean and dry.  Pt reports he will see rash intermittently.  Denies itching or burning.  Currently has enough Clotrimazole.  Has hip replacement on Tuesday and wants to make sure he is infxn free. ? ? ?Review of Systems ?For ROS see HPI  ? ?This visit occurred during the SARS-CoV-2 public health emergency.  Safety protocols were in place, including screening questions prior to the visit, additional usage of staff PPE, and extensive cleaning of exam room while observing appropriate contact time as indicated for disinfecting solutions.   ?   ?Objective:  ? Physical Exam ?Vitals reviewed.  ?Constitutional:   ?   General: He is not in acute distress. ?   Appearance: Normal appearance. He is obese. He is not ill-appearing.  ?Genitourinary: ?   Penis: Uncircumcised. No erythema, discharge or lesions.   ?Skin: ?   General: Skin is warm and dry.  ?Neurological:  ?   Mental Status: He is alert.  ? ? ? ? ? ?   ?Assessment & Plan:  ? ?Penile rash- currently resolved.  No evidence of fungal infxn.  Pt should be fine to proceed w/ surgery.  He can continue to use the Clotrimazole cream as needed. ?

## 2021-07-20 ENCOUNTER — Ambulatory Visit: Payer: Medicare HMO | Admitting: Family Medicine

## 2021-07-25 DIAGNOSIS — M1611 Unilateral primary osteoarthritis, right hip: Secondary | ICD-10-CM | POA: Diagnosis not present

## 2021-08-29 DIAGNOSIS — Z5189 Encounter for other specified aftercare: Secondary | ICD-10-CM | POA: Diagnosis not present

## 2021-09-27 ENCOUNTER — Other Ambulatory Visit: Payer: Self-pay | Admitting: Family Medicine

## 2021-09-27 DIAGNOSIS — G629 Polyneuropathy, unspecified: Secondary | ICD-10-CM

## 2021-09-27 NOTE — Progress Notes (Signed)
Gabapentin refilled

## 2021-11-05 ENCOUNTER — Other Ambulatory Visit: Payer: Self-pay | Admitting: Family Medicine

## 2021-11-05 DIAGNOSIS — G629 Polyneuropathy, unspecified: Secondary | ICD-10-CM

## 2021-11-05 NOTE — Progress Notes (Signed)
Subjective:  Patient ID: David Dalton, male    DOB: [birth year]  Age: 66 y.o. MRN: [id number]  CC:  Chief Complaint  Patient presents with   Diabetes   Hypertension   Hyperlipidemia    HPI [name] presents for   Diabetes: With hyperlipidemia, hyperglycemia, peripheral neuropathy with possible lumbar source versus diabetes. Diet-controlled diabetes.  He is on ACE inhibitor and statin. Gabapentin 300 mg at bedtime for neuropathy, recently refilled. Not needing nightly. About 2 times per week.  Home readings: none.  Microalbumin: Normal 08/15/2020 - unable to provide today.  Optho, foot exam, pneumovax:  Foot exam due Diabetic Foot Exam - Simple   Simple Foot Form Visual Inspection No deformities, no ulcerations, no other skin breakdown bilaterally: Yes Sensation Testing Pulse Check Posterior Tibialis and Dorsalis pulse intact bilaterally: Yes Comments Dec sensation R great toe,, 4th toe, forefoot,  few areas in front of heel.  Dec sensation L 2nd and 4th toes.      Lab Results  Component Value Date   HGBA1C 7.1 (A) 04/20/2021   HGBA1C 7.8 (H) 08/15/2020   HGBA1C 7.2 (H) 09/17/2019   Lab Results  Component Value Date   MICROALBUR 1.2 08/15/2020   CREATININE 0.93 07/06/2021   Hyperlipidemia: Started on Lipitor 10 mg in February. No new myalgias or side effects. Fasting today.  Lab Results  Component Value Date   CHOL 260 (H) 07/06/2021   HDL 38.30 (L) 07/06/2021   LDLDIRECT 172.0 07/06/2021   TRIG 292.0 (H) 07/06/2021   CHOLHDL 7 07/06/2021   Hypertension: Lisinopril 10 mg daily, no new side effects.  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  Has been doing well.  Happy that he had that procedure.  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 Authorizing Provider  atorvastatin (LIPITOR) 10 MG tablet Take 1 tablet (10 mg total) by mouth daily. 07/06/21   Wendie Agreste, MD  Cholecalciferol (VITAMIN D3) 250 MCG (10000 UT) TABS Take 1 tablet by mouth daily.    [provider]  clotrimazole (LOTRIMIN) 1 % cream Apply 1 application topically 2 (two) times daily. As needed for genital rash. 05/25/21   Wendie Agreste, MD  docusate sodium (COLACE) 100 MG capsule Take 100 mg by mouth 2 (two) times daily.    [provider]  gabapentin (NEURONTIN) 300 MG capsule TAKE 1 CAPSULE BY MOUTH AT BEDTIME AS NEEDED 09/27/21   Wendie Agreste, MD  lisinopril (ZESTRIL) 10 MG tablet Take 1 tablet (10 mg total) by mouth daily. 04/20/21   Wendie Agreste, MD   Social History   Socioeconomic History   Marital status: Married    Spouse name: Not on file   Number of children: Not on file   Years of education: Not on file   Highest education level: Not on file  Occupational History   Not on file  Tobacco Use   Smoking status: Never    Smokeless tobacco: Never  Vaping Use   Vaping Use: Never used  Substance and Sexual Activity   Alcohol use: Not Currently   Drug use: Not Currently   Sexual activity: Yes  Other Topics Concern   Not on file  Social History Narrative   Not on file   Social Determinants of Health   Financial Resource Strain: Low Risk  (12/29/2020)   Overall Financial Resource Strain (CARDIA)    Difficulty of Paying Living Expenses: Not hard at all  Food Insecurity: No Food Insecurity (12/29/2020)   Hunger Vital Sign    Worried About Running Out of Food in the Last Year: Never true    [place] in the Last Year: Never true  Transportation Needs: No Transportation Needs (12/29/2020)   PRAPARE - Hydrologist (Medical): No    Lack of Transportation (Non-Medical): No  Physical Activity: Insufficiently Active (12/29/2020)   Exercise Vital Sign    Days of Exercise per Week: 3 days    Minutes of Exercise per Session: 40 min  Stress: No Stress Concern Present (12/29/2020)   [place]    Feeling of Stress : Not at all  Social Connections: Socially Isolated (12/29/2020)   Social Connection and Isolation Panel [NHANES]    Frequency of Communication with Friends and Family: More than three times a week    Frequency of Social Gatherings with Friends and Family: More than three times a week    Attends Religious Services: Never    Marine scientist or Organizations: No    Attends Archivist Meetings: Never    Marital Status: Separated  Intimate Partner Violence: Not At Risk (12/29/2020)   Humiliation, Afraid, Rape, and Kick questionnaire    Fear of Current or Ex-Partner: No    Emotionally Abused: No    Physically Abused: No    Sexually Abused: No    Review of Systems  Constitutional:  Negative for fatigue and unexpected weight change.  Eyes:  Negative for visual disturbance.  Respiratory:   Negative for cough, chest tightness and shortness of breath.   Cardiovascular:  Negative for chest pain, palpitations and leg swelling.  Gastrointestinal:  Negative for abdominal pain and blood in stool.  Neurological:  Negative for dizziness, light-headedness and headaches.     Objective:   Vitals:   11/06/21 0817  BP: 132/78  Pulse: 84  Resp: 16  Temp: 98.1 F (36.7 C)  TempSrc: Oral  SpO2: 94%  Weight: 220 lb 6.4 oz (100 kg)  Height: '5\' 8"'$  (1.727 m)     Physical Exam Vitals reviewed.  Constitutional:      Appearance: He is well-developed.  HENT:     Head: Normocephalic and atraumatic.  Neck:     Vascular: No carotid bruit or JVD.  Cardiovascular:     Rate and Rhythm: Normal rate and regular rhythm.     Heart sounds: Normal heart sounds. No murmur heard. Pulmonary:     Effort: Pulmonary effort is normal.     Breath sounds: Normal breath sounds. No rales.  Musculoskeletal:     Right lower leg: No edema.     Left lower leg: No edema.  Skin:    General: Skin is warm and dry.  Neurological:     Mental Status: He is alert and oriented to person, place, and time.  Psychiatric:        Mood and Affect: Mood normal.        Assessment & Plan:  STEFANOS HAYNESWORTH is a 66 y.o. male . Type 2 diabetes mellitus with hyperglycemia, without long-term current use of insulin (HCC) - Plan: Comprehensive metabolic panel, Hemoglobin A1c  -Check A1c, CMP.  Diet controlled for now.  Unable to provide urine test today, check urine microalbumin next visit.  Benign essential hypertension - Plan: lisinopril (ZESTRIL) 10 MG tablet  -  Stable, tolerating current regimen. Medications refilled. Labs pending as above.   Hyperlipidemia associated with type 2 diabetes mellitus ([place]) - Plan: atorvastatin (LIPITOR) 10 MG tablet, Lipid panel  Stable, tolerating current regimen. Medications refilled. Labs pending as above.   Unilateral primary osteoarthritis, right hip  -Status post total hip  arthroplasty as above.  Continue follow-up with Ortho as planned.  Meds ordered this encounter  Medications   atorvastatin (LIPITOR) 10 MG tablet    Sig: Take 1 tablet (10 mg total) by mouth daily.    Dispense:  90 tablet    Refill:  1   lisinopril (ZESTRIL) 10 MG tablet    Sig: Take 1 tablet (10 mg total) by mouth daily.    Dispense:  90 tablet    Refill:  1   Patient Instructions  No medication changes for now, follow-up in 3 months.  Plan on urine testing at that time.  Let me know if there are questions.  If any concerns on labs I will let you know.    Signed,   Merri Ray, MD [place] 11/06/21 8:49 AM

## 2021-11-06 ENCOUNTER — Encounter: Payer: Self-pay | Admitting: Family Medicine

## 2021-11-06 ENCOUNTER — Ambulatory Visit (INDEPENDENT_AMBULATORY_CARE_PROVIDER_SITE_OTHER): Payer: Medicare HMO | Admitting: Family Medicine

## 2021-11-06 VITALS — BP 132/78 | HR 84 | Temp 98.1°F | Resp 16 | Ht 68.0 in | Wt 220.4 lb

## 2021-11-06 DIAGNOSIS — E1165 Type 2 diabetes mellitus with hyperglycemia: Secondary | ICD-10-CM | POA: Diagnosis not present

## 2021-11-06 DIAGNOSIS — I10 Essential (primary) hypertension: Secondary | ICD-10-CM

## 2021-11-06 DIAGNOSIS — M1611 Unilateral primary osteoarthritis, right hip: Secondary | ICD-10-CM

## 2021-11-06 DIAGNOSIS — E1169 Type 2 diabetes mellitus with other specified complication: Secondary | ICD-10-CM | POA: Diagnosis not present

## 2021-11-06 DIAGNOSIS — E785 Hyperlipidemia, unspecified: Secondary | ICD-10-CM

## 2021-11-06 LAB — COMPREHENSIVE METABOLIC PANEL
ALT: 32 U/L (ref 0–53)
AST: 31 U/L (ref 0–37)
Albumin: 4.6 g/dL (ref 3.5–5.2)
Alkaline Phosphatase: 82 U/L (ref 39–117)
BUN: 17 mg/dL (ref 6–23)
CO2: 25 mEq/L (ref 19–32)
Calcium: 9.8 mg/dL (ref 8.4–10.5)
Chloride: 102 mEq/L (ref 96–112)
Creatinine, Ser: 1 mg/dL (ref 0.40–1.50)
GFR: 77.85 mL/min (ref >60.00)
Glucose, Bld: 163 mg/dL — ABNORMAL HIGH (ref 70–99)
Potassium: 4.3 mEq/L (ref 3.5–5.1)
Sodium: 136 mEq/L (ref 135–145)
Total Bilirubin: 0.5 mg/dL (ref 0.2–1.2)
Total Protein: 7.3 g/dL (ref 6.0–8.3)

## 2021-11-06 LAB — LIPID PANEL
Cholesterol: 296 mg/dL — ABNORMAL HIGH (ref 0–200)
HDL: 37.4 mg/dL — ABNORMAL LOW (ref >39.00)
NonHDL: 258.86
Total CHOL/HDL Ratio: 8
Triglycerides: 281 mg/dL — ABNORMAL HIGH (ref 0.0–149.0)
VLDL: 56.2 mg/dL — ABNORMAL HIGH (ref 0.0–40.0)

## 2021-11-06 LAB — LDL CHOLESTEROL, DIRECT: Direct LDL: 202 mg/dL

## 2021-11-06 LAB — HEMOGLOBIN A1C: Hgb A1c MFr Bld: 7.9 % — ABNORMAL HIGH (ref 4.6–6.5)

## 2021-11-06 MED ORDER — ATORVASTATIN CALCIUM 10 MG PO TABS: 10.0000 mg | ORAL_TABLET | Freq: Every day | ORAL | 1 refills | Status: AC

## 2021-11-06 MED ORDER — LISINOPRIL 10 MG PO TABS: 10.0000 mg | ORAL_TABLET | Freq: Every day | ORAL | 1 refills | Status: DC

## 2021-11-06 NOTE — Patient Instructions (Signed)
No medication changes for now, follow-up in 3 months.  Plan on urine testing at that time.  Let me know if there are questions.  If any concerns on labs I will let you know.

## 2021-11-06 NOTE — Telephone Encounter (Signed)
OV today. Refilled.

## 2021-11-07 ENCOUNTER — Telehealth: Payer: Self-pay

## 2021-11-07 NOTE — Telephone Encounter (Signed)
-----   Message from Wendie Agreste, MD sent at 11/07/2021 10:22 AM EDT ----- Call patient  Blood sugar elevated at 163 but other electrolytes looked okay.  Cholesterol levels were elevated, and higher than last readings.  50-monthblood sugar test has also increased. Please verify if he is taking the Lipitor daily.  If so, then we will need to increase dose.  Also recommend starting metformin once per day for diabetes.  Let me know if he is okay with this change and I will send in a prescription.

## 2021-11-09 NOTE — Telephone Encounter (Signed)
LM to call back for lab results.  

## 2021-12-21 ENCOUNTER — Telehealth: Payer: Self-pay | Admitting: Family Medicine

## 2021-12-21 NOTE — Telephone Encounter (Signed)
Left message for patient to call back and schedule Medicare Annual Wellness Visit (AWV).   Please offer to do virtually or by telephone.  Left office number and my jabber [phone].  Last AWV:12/29/2020  Please schedule at anytime with Nurse Health Advisor.

## 2022-01-17 ENCOUNTER — Telehealth: Payer: Medicare HMO

## 2022-01-18 ENCOUNTER — Ambulatory Visit (INDEPENDENT_AMBULATORY_CARE_PROVIDER_SITE_OTHER): Payer: Medicare HMO

## 2022-01-18 VITALS — Ht 68.0 in | Wt 221.0 lb

## 2022-01-18 DIAGNOSIS — Z0000 Encounter for general adult medical examination without abnormal findings: Secondary | ICD-10-CM

## 2022-01-18 NOTE — Patient Instructions (Signed)
David Dalton , Thank you for taking time to come for your Medicare Wellness Visit. I appreciate your ongoing commitment to your health goals. Please review the following plan we discussed and let me know if I can assist you in the future.   These are the goals we discussed:  Goals      Improve LDL     Timeframe:  Long-Range Goal Priority:  High Start Date: 07/05/21                            Expected End Date:  01/06/22                     Follow Up Date 10/06/21    Continue to work towards improving LDL to goal < 70.    Why is this important?   These steps will help you keep on track with your medicines.   Notes:      Weight (lb) < 200 lb (90.7 kg)        This is a list of the screening recommended for you and due dates:  Health Maintenance  Topic Date Due   COVID-19 Vaccine (1) Never done   Complete foot exam   11/19/2019   Yearly kidney health urinalysis for diabetes  08/15/2021   Flu Shot  Never done   Eye exam for diabetics  12/27/2021   Pneumonia Vaccine (1 - PCV) 05/25/2022*   Hepatitis C Screening: USPSTF Recommendation to screen - Ages 18-79 yo.  11/07/2022*   Hemoglobin A1C  05/09/2022   Yearly kidney function blood test for diabetes  11/07/2022   Tetanus Vaccine  01/01/2029   Colon Cancer Screening  06/30/2030   HPV Vaccine  Aged Out   Zoster (Shingles) Vaccine  Discontinued  *Topic was postponed. The date shown is not the original due date.    Advanced directives: Please bring a copy of your health care power of attorney and living will to the office to be added to your chart at your convenience.   Conditions/risks identified: Aim for 30 minutes of exercise or brisk walking, 6-8 glasses of water, and 5 servings of fruits and vegetables each day.   Next appointment: Follow up in one year for your annual wellness visit.   Preventive Care 19 Years and Older, Male  Preventive care refers to lifestyle choices and visits with your health care provider that can  promote health and wellness. What does preventive care include? A yearly physical exam. This is also called an annual well check. Dental exams once or twice a year. Routine eye exams. Ask your health care provider how often you should have your eyes checked. Personal lifestyle choices, including: Daily care of your teeth and gums. Regular physical activity. Eating a healthy diet. Avoiding tobacco and drug use. Limiting alcohol use. Practicing safe sex. Taking low doses of aspirin every day. Taking vitamin and mineral supplements as recommended by your health care provider. What happens during an annual well check? The services and screenings done by your health care provider during your annual well check will depend on your age, overall health, lifestyle risk factors, and family history of disease. Counseling  Your health care provider may ask you questions about your: Alcohol use. Tobacco use. Drug use. Emotional well-being. Home and relationship well-being. Sexual activity. Eating habits. History of falls. Memory and ability to understand (cognition). Work and work Statistician. Screening  You may have the following tests  or measurements: Height, weight, and BMI. Blood pressure. Lipid and cholesterol levels. These may be checked every 5 years, or more frequently if you are over 70 years old. Skin check. Lung cancer screening. You may have this screening every year starting at age 75 if you have a 30-pack-year history of smoking and currently smoke or have quit within the past 15 years. Fecal occult blood test (FOBT) of the stool. You may have this test every year starting at age 68. Flexible sigmoidoscopy or colonoscopy. You may have a sigmoidoscopy every 5 years or a colonoscopy every 10 years starting at age 40. Prostate cancer screening. Recommendations will vary depending on your family history and other risks. Hepatitis C blood test. Hepatitis B blood test. Sexually  transmitted disease (STD) testing. Diabetes screening. This is done by checking your blood sugar (glucose) after you have not eaten for a while (fasting). You may have this done every 1-3 years. Abdominal aortic aneurysm (AAA) screening. You may need this if you are a current or former smoker. Osteoporosis. You may be screened starting at age 73 if you are at high risk. Talk with your health care provider about your test results, treatment options, and if necessary, the need for more tests. Vaccines  Your health care provider may recommend certain vaccines, such as: Influenza vaccine. This is recommended every year. Tetanus, diphtheria, and acellular pertussis (Tdap, Td) vaccine. You may need a Td booster every 10 years. Zoster vaccine. You may need this after age 4. Pneumococcal 13-valent conjugate (PCV13) vaccine. One dose is recommended after age 70. Pneumococcal polysaccharide (PPSV23) vaccine. One dose is recommended after age 52. Talk to your health care provider about which screenings and vaccines you need and how often you need them. This information is not intended to replace advice given to you by your health care provider. Make sure you discuss any questions you have with your health care provider. Document Released: 04/29/2015 Document Revised: 12/21/2015 Document Reviewed: 02/01/2015 Elsevier Interactive Patient Education  2017 [employer] Prevention in the Home Falls can cause injuries. They can happen to people of all ages. There are many things you can do to make your home safe and to help prevent falls. What can I do on the outside of my home? Regularly fix the edges of walkways and driveways and fix any cracks. Remove anything that might make you trip as you walk through a door, such as a raised step or threshold. Trim any bushes or trees on the path to your home. Use bright outdoor lighting. Clear any walking paths of anything that might make someone trip, such as  rocks or tools. Regularly check to see if handrails are loose or broken. Make sure that both sides of any steps have handrails. Any raised decks and porches should have guardrails on the edges. Have any leaves, snow, or ice cleared regularly. Use sand or salt on walking paths during winter. Clean up any spills in your garage right away. This includes oil or grease spills. What can I do in the bathroom? Use night lights. Install grab bars by the toilet and in the tub and shower. Do not use towel bars as grab bars. Use non-skid mats or decals in the tub or shower. If you need to sit down in the shower, use a plastic, non-slip stool. Keep the floor dry. Clean up any water that spills on the floor as soon as it happens. Remove soap buildup in the tub or shower regularly. Attach  bath mats securely with double-sided non-slip rug tape. Do not have throw rugs and other things on the floor that can make you trip. What can I do in the bedroom? Use night lights. Make sure that you have a light by your bed that is easy to reach. Do not use any sheets or blankets that are too big for your bed. They should not hang down onto the floor. Have a firm chair that has side arms. You can use this for support while you get dressed. Do not have throw rugs and other things on the floor that can make you trip. What can I do in the kitchen? Clean up any spills right away. Avoid walking on wet floors. Keep items that you use a lot in easy-to-reach places. If you need to reach something above you, use a strong step stool that has a grab bar. Keep electrical cords out of the way. Do not use floor polish or wax that makes floors slippery. If you must use wax, use non-skid floor wax. Do not have throw rugs and other things on the floor that can make you trip. What can I do with my stairs? Do not leave any items on the stairs. Make sure that there are handrails on both sides of the stairs and use them. Fix handrails  that are broken or loose. Make sure that handrails are as long as the stairways. Check any carpeting to make sure that it is firmly attached to the stairs. Fix any carpet that is loose or worn. Avoid having throw rugs at the top or bottom of the stairs. If you do have throw rugs, attach them to the floor with carpet tape. Make sure that you have a light switch at the top of the stairs and the bottom of the stairs. If you do not have them, ask someone to add them for you. What else can I do to help prevent falls? Wear shoes that: Do not have high heels. Have rubber bottoms. Are comfortable and fit you well. Are closed at the toe. Do not wear sandals. If you use a stepladder: Make sure that it is fully opened. Do not climb a closed stepladder. Make sure that both sides of the stepladder are locked into place. Ask someone to hold it for you, if possible. Clearly mark and make sure that you can see: Any grab bars or handrails. First and last steps. Where the edge of each step is. Use tools that help you move around (mobility aids) if they are needed. These include: Canes. Walkers. Scooters. Crutches. Turn on the lights when you go into a dark area. Replace any light bulbs as soon as they burn out. Set up your furniture so you have a clear path. Avoid moving your furniture around. If any of your floors are uneven, fix them. If there are any pets around you, be aware of where they are. Review your medicines with your doctor. Some medicines can make you feel dizzy. This can increase your chance of falling. Ask your doctor what other things that you can do to help prevent falls. This information is not intended to replace advice given to you by your health care provider. Make sure you discuss any questions you have with your health care provider. Document Released: 01/27/2009 Document Revised: 09/08/2015 Document Reviewed: 05/07/2014 Elsevier Interactive Patient Education  2017 [employer].

## 2022-01-18 NOTE — Progress Notes (Signed)
Subjective:   David Dalton is a 66 y.o. male who presents for Medicare Annual/Subsequent preventive examination.   Virtual Visit via Telephone Note  I connected with  David Dalton on 01/18/22 at 10:30 AM EDT by telephone and verified that I am speaking with the correct person using two identifiers.  Location: Patient: home Provider: [place]  Persons participating in the virtual visit: patient/Nurse Health Advisor   I discussed the limitations, risks, security and privacy concerns of performing an evaluation and management service by telephone and the availability of in person appointments. The patient expressed understanding and agreed to proceed.  Interactive audio and video telecommunications were attempted between this nurse and patient, however failed, due to patient having technical difficulties OR patient did not have access to video capability.  We continued and completed visit with audio only.  Some vital signs may be absent or patient reported.   Daphane Shepherd, LPN  Review of Systems     Cardiac Risk Factors include: advanced age (>8mn, >>66women);diabetes mellitus;male gender     Objective:    Today's Vitals   01/18/22 1025  Weight: 221 lb (100.2 kg)  Height: '5\' 8"'$  (1.727 m)   Body mass index is 33.6 kg/m.     01/18/2022   10:29 AM 12/29/2020    8:43 AM  Advanced Directives  Does Patient Have a Medical Advance Directive? Yes No  Type of [place] will   Copy of [place] Chart? No - copy requested   Would patient like information on creating a medical advance directive?  No - Patient declined    Current Medications (verified) Outpatient Encounter Medications as of 01/18/2022  Medication Sig   atorvastatin (LIPITOR) 10 MG tablet Take 1 tablet (10 mg total) by mouth daily.   Cholecalciferol (VITAMIN D3) 250 MCG (10000 UT) TABS Take 1 tablet by mouth daily.   clotrimazole (LOTRIMIN) 1  % cream Apply 1 application topically 2 (two) times daily. As needed for genital rash.   docusate sodium (COLACE) 100 MG capsule Take 100 mg by mouth 2 (two) times daily.   gabapentin (NEURONTIN) 300 MG capsule TAKE 1 CAPSULE BY MOUTH AT BEDTIME AS NEEDED   lisinopril (ZESTRIL) 10 MG tablet Take 1 tablet (10 mg total) by mouth daily.   No facility-administered encounter medications on file as of 01/18/2022.    Allergies (verified) Patient has no known allergies.   History: Past Medical History:  Diagnosis Date   Anxiety    History of chickenpox    Hypertension    Past Surgical History:  Procedure Laterality Date   FINGER SURGERY     Family History  Problem Relation Age of Onset   Cancer Mother    Anxiety disorder Mother    Diabetes Mother    Alcohol abuse Father    Heart disease Father    Heart attack Father [phone]  Cancer Sister    Diabetes Brother    Cancer Sister    Cancer Sister    Social History   Socioeconomic History   Marital status: Married    Spouse name: Not on file   Number of children: Not on file   Years of education: Not on file   Highest education level: Not on file  Occupational History   Not on file  Tobacco Use   Smoking status: Never   Smokeless tobacco: Never  Vaping Use   Vaping Use: Never used  Substance  and Sexual Activity   Alcohol use: Not Currently   Drug use: Not Currently   Sexual activity: Yes  Other Topics Concern   Not on file  Social History Narrative   Not on file   Social Determinants of Health   Financial Resource Strain: Low Risk  (12/29/2020)   Overall Financial Resource Strain (CARDIA)    Difficulty of Paying Living Expenses: Not hard at all  Food Insecurity: No Food Insecurity (01/18/2022)   Hunger Vital Sign    Worried About Running Out of Food in the Last Year: Never true    Ran Out of Food in the Last Year: Never true  Transportation Needs: No Transportation Needs (01/18/2022)   PRAPARE - Civil engineer, contracting (Medical): No    Lack of Transportation (Non-Medical): No  Physical Activity: Insufficiently Active (01/18/2022)   Exercise Vital Sign    Days of Exercise per Week: 3 days    Minutes of Exercise per Session: 30 min  Stress: No Stress Concern Present (01/18/2022)   [place]    Feeling of Stress : Not at all  Social Connections: Moderately Isolated (01/18/2022)   Social Connection and Isolation Panel [NHANES]    Frequency of Communication with Friends and Family: More than three times a week    Frequency of Social Gatherings with Friends and Family: More than three times a week    Attends Religious Services: Never    Marine scientist or Organizations: Yes    Attends Music therapist: More than 4 times per year    Marital Status: Divorced    Tobacco Counseling Counseling given: Not Answered   Clinical Intake:  Pre-visit preparation completed: Yes  Pain : No/denies pain     Nutritional Risks: None Diabetes: No  How often do you need to have someone help you when you read instructions, pamphlets, or other written materials from your doctor or pharmacy?: 1 - Never  Diabetic?yes  Nutrition Risk Assessment:  Has the patient had any N/V/D within the last 2 months?  No  Does the patient have any non-healing wounds?  No  Has the patient had any unintentional weight loss or weight gain?  No   Diabetes:  Is the patient diabetic?  Yes  If diabetic, was a CBG obtained today?  No  Did the patient bring in their glucometer from home?  No  How often do you monitor your CBG's? Never .   Financial Strains and Diabetes Management:  Are you having any financial strains with the device, your supplies or your medication? No .  Does the patient want to be seen by Chronic Care Management for management of their diabetes?  No  Would the patient like to be referred to a Nutritionist or for  Diabetic Management?  No   Diabetic Exams:  Diabetic Eye Exam: Completed 09/2021 Diabetic Foot Exam: Overdue, Pt has been advised about the importance in completing this exam. Pt is scheduled for diabetic foot exam on next office visit .   Interpreter Needed?: No  Information entered by :: Jadene Pierini, LPN   Activities of Daily Living    01/18/2022   10:29 AM  In your present state of health, do you have any difficulty performing the following activities:  Hearing? 0  Vision? 0  Difficulty concentrating or making decisions? 0  Walking or climbing stairs? 0  Dressing or bathing? 0  Doing errands, shopping? 0  Preparing Food and eating ? N  Using the Toilet? N  In the past six months, have you accidently leaked urine? N  Do you have problems with loss of bowel control? N  Managing your Medications? N  Managing your Finances? N  Housekeeping or managing your Housekeeping? N    Patient Care Team: Wendie Agreste, MD as PCP - General (Family Medicine) Edythe Clarity, [facility] (Pharmacist)  Indicate any recent Medical Services you may have received from other than [facility] providers in the past year (date may be approximate).     Assessment:   This is a routine wellness examination for [place].  Hearing/Vision screen Vision Screening - Comments:: Annual eye exams   Dietary issues and exercise activities discussed: Current Exercise Habits: Home exercise routine, Type of exercise: walking, Time (Minutes): 30, Frequency (Times/Week): 3, Weekly Exercise (Minutes/Week): 90, Intensity: Mild, Exercise limited by: None identified;orthopedic condition(s)   Goals Addressed             This Visit's Progress    Improve LDL   On track    Timeframe:  Long-Range Goal Priority:  High Start Date: 07/05/21                            Expected End Date:  01/06/22                     Follow Up Date 10/06/21    Continue to work towards improving LDL to goal < 70.    Why is this  important?   These steps will help you keep on track with your medicines.   Notes:        Depression Screen    01/18/2022   10:28 AM 11/06/2021    8:21 AM 07/06/2021   10:22 AM 04/20/2021   10:38 AM 12/29/2020    8:42 AM 11/17/2020   10:31 AM 09/15/2020    1:22 PM  PHQ 2/9 Scores  PHQ - 2 Score 0 0 0 0 0 0 0  PHQ- 9 Score   0  0 1 0    Fall Risk    01/18/2022   10:26 AM 11/06/2021    8:21 AM 07/06/2021   10:23 AM 04/20/2021   10:37 AM 12/29/2020    8:33 AM  Fall Risk   Falls in the past year? 0 0 0 0 0  Number falls in past yr: 0 0  0 0  Injury with Fall? 0 0  0 0  Risk for fall due to : No Fall Risks No Fall Risks No Fall Risks No Fall Risks   Follow up Falls prevention discussed Falls evaluation completed Falls evaluation completed Falls evaluation completed Falls evaluation completed;Education provided;Falls prevention discussed    FALL RISK PREVENTION PERTAINING TO THE HOME:  Any stairs in or around the home? No  If so, are there any without handrails? No  Home free of loose throw rugs in walkways, pet beds, electrical cords, etc? Yes  Adequate lighting in your home to reduce risk of falls? Yes   ASSISTIVE DEVICES UTILIZED TO PREVENT FALLS:  Life alert? No  Use of a cane, walker or w/c? No  Grab bars in the bathroom? No  Shower chair or bench in shower? No  Elevated toilet seat or a handicapped toilet? No          01/18/2022   10:29 AM  6CIT Screen  What Year? 0 points  What month? 0 points  What time? 0 points  Count back from 20 0 points  Months in reverse 0 points  Repeat phrase 0 points  Total Score 0 points    Immunizations Immunization History  Administered Date(s) Administered   Tdap 01/02/2019    TDAP status: Up to date  Flu Vaccine status: Declined, Education has been provided regarding the importance of this vaccine but patient still declined. Advised may receive this vaccine at local pharmacy or Health Dept. Aware to provide a copy of the  vaccination record if obtained from local pharmacy or Health Dept. Verbalized acceptance and understanding.  Pneumococcal vaccine status: Declined,  Education has been provided regarding the importance of this vaccine but patient still declined. Advised may receive this vaccine at local pharmacy or Health Dept. Aware to provide a copy of the vaccination record if obtained from local pharmacy or Health Dept. Verbalized acceptance and understanding.   Covid-19 vaccine status: Completed vaccines  Qualifies for Shingles Vaccine? Yes   Zostavax completed No   Shingrix Completed?: No.    Education has been provided regarding the importance of this vaccine. Patient has been advised to call insurance company to determine out of pocket expense if they have not yet received this vaccine. Advised may also receive vaccine at local pharmacy or Health Dept. Verbalized acceptance and understanding.  Screening Tests Health Maintenance  Topic Date Due   COVID-19 Vaccine (1) Never done   FOOT EXAM  11/19/2019   Diabetic kidney evaluation - Urine ACR  08/15/2021   INFLUENZA VACCINE  Never done   OPHTHALMOLOGY EXAM  12/27/2021   Pneumonia Vaccine 82+ Years old (1 - PCV) 05/25/2022 (Originally 04/17/2019)   Hepatitis C Screening  11/07/2022 (Originally 04/16/1973)   HEMOGLOBIN A1C  05/09/2022   Diabetic kidney evaluation - GFR measurement  11/07/2022   TETANUS/TDAP  01/01/2029   COLONOSCOPY (Pts 45-34yr Insurance coverage will need to be confirmed)  06/30/2030   HPV VACCINES  Aged Out   Zoster Vaccines- Shingrix  Discontinued    Health Maintenance  Health Maintenance Due  Topic Date Due   COVID-19 Vaccine (1) Never done   FOOT EXAM  11/19/2019   Diabetic kidney evaluation - Urine ACR  08/15/2021   INFLUENZA VACCINE  Never done   OPHTHALMOLOGY EXAM  12/27/2021    Colorectal cancer screening: Type of screening: Colonoscopy. Completed 06/29/2020. Repeat every 10 years  Lung Cancer Screening: (Low Dose  CT Chest recommended if Age 67-80years, 30 pack-year currently smoking OR have quit w/in 15years.) does not qualify.   Lung Cancer Screening Referral: n/a  Additional Screening:  Hepatitis C Screening: does not qualify;   Vision Screening: Recommended annual ophthalmology exams for early detection of glaucoma and other disorders of the eye. Is the patient up to date with their annual eye exam?  Yes  Who is the provider or what is the name of the office in which the patient attends annual eye exams? Dr.Lee  If pt is not established with a provider, would they like to be referred to a provider to establish care? No .   Dental Screening: Recommended annual dental exams for proper oral hygiene  Community Resource Referral / Chronic Care Management: CRR required this visit?  No   CCM required this visit?  No      Plan:     I have personally reviewed and noted the following in the patient's chart:   Medical and social history Use of alcohol, tobacco or illicit  drugs  Current medications and supplements including opioid prescriptions. Patient is not currently taking opioid prescriptions. Functional ability and status Nutritional status Physical activity Advanced directives List of other physicians Hospitalizations, surgeries, and ER visits in previous 12 months Vitals Screenings to include cognitive, depression, and falls Referrals and appointments  In addition, I have reviewed and discussed with patient certain preventive protocols, quality metrics, and best practice recommendations. A written personalized care plan for preventive services as well as general preventive health recommendations were provided to patient.     Daphane Shepherd, LPN   [id number]/04/[id number]   Nurse Notes: Due Flu Everlean Alstrom Vaccine

## 2022-02-07 ENCOUNTER — Ambulatory Visit: Payer: Medicare HMO | Admitting: Family Medicine

## 2022-04-26 DIAGNOSIS — M9904 Segmental and somatic dysfunction of sacral region: Secondary | ICD-10-CM | POA: Diagnosis not present

## 2022-04-26 DIAGNOSIS — M9901 Segmental and somatic dysfunction of cervical region: Secondary | ICD-10-CM | POA: Diagnosis not present

## 2022-04-26 DIAGNOSIS — M50122 Cervical disc disorder at C5-C6 level with radiculopathy: Secondary | ICD-10-CM | POA: Diagnosis not present

## 2022-04-26 DIAGNOSIS — M9903 Segmental and somatic dysfunction of lumbar region: Secondary | ICD-10-CM | POA: Diagnosis not present

## 2022-04-30 DIAGNOSIS — M9904 Segmental and somatic dysfunction of sacral region: Secondary | ICD-10-CM | POA: Diagnosis not present

## 2022-04-30 DIAGNOSIS — M9901 Segmental and somatic dysfunction of cervical region: Secondary | ICD-10-CM | POA: Diagnosis not present

## 2022-04-30 DIAGNOSIS — M50122 Cervical disc disorder at C5-C6 level with radiculopathy: Secondary | ICD-10-CM | POA: Diagnosis not present

## 2022-04-30 DIAGNOSIS — M9903 Segmental and somatic dysfunction of lumbar region: Secondary | ICD-10-CM | POA: Diagnosis not present

## 2022-05-07 DIAGNOSIS — M9904 Segmental and somatic dysfunction of sacral region: Secondary | ICD-10-CM | POA: Diagnosis not present

## 2022-05-07 DIAGNOSIS — M9903 Segmental and somatic dysfunction of lumbar region: Secondary | ICD-10-CM | POA: Diagnosis not present

## 2022-05-07 DIAGNOSIS — M9901 Segmental and somatic dysfunction of cervical region: Secondary | ICD-10-CM | POA: Diagnosis not present

## 2022-05-07 DIAGNOSIS — M50122 Cervical disc disorder at C5-C6 level with radiculopathy: Secondary | ICD-10-CM | POA: Diagnosis not present

## 2022-05-08 DIAGNOSIS — M50122 Cervical disc disorder at C5-C6 level with radiculopathy: Secondary | ICD-10-CM | POA: Diagnosis not present

## 2022-05-08 DIAGNOSIS — M9901 Segmental and somatic dysfunction of cervical region: Secondary | ICD-10-CM | POA: Diagnosis not present

## 2022-05-08 DIAGNOSIS — M9903 Segmental and somatic dysfunction of lumbar region: Secondary | ICD-10-CM | POA: Diagnosis not present

## 2022-05-08 DIAGNOSIS — M9904 Segmental and somatic dysfunction of sacral region: Secondary | ICD-10-CM | POA: Diagnosis not present

## 2022-05-10 DIAGNOSIS — M9901 Segmental and somatic dysfunction of cervical region: Secondary | ICD-10-CM | POA: Diagnosis not present

## 2022-05-10 DIAGNOSIS — M9903 Segmental and somatic dysfunction of lumbar region: Secondary | ICD-10-CM | POA: Diagnosis not present

## 2022-05-10 DIAGNOSIS — M50122 Cervical disc disorder at C5-C6 level with radiculopathy: Secondary | ICD-10-CM | POA: Diagnosis not present

## 2022-05-10 DIAGNOSIS — M9904 Segmental and somatic dysfunction of sacral region: Secondary | ICD-10-CM | POA: Diagnosis not present

## 2022-05-14 DIAGNOSIS — M9901 Segmental and somatic dysfunction of cervical region: Secondary | ICD-10-CM | POA: Diagnosis not present

## 2022-05-14 DIAGNOSIS — M9903 Segmental and somatic dysfunction of lumbar region: Secondary | ICD-10-CM | POA: Diagnosis not present

## 2022-05-14 DIAGNOSIS — M9904 Segmental and somatic dysfunction of sacral region: Secondary | ICD-10-CM | POA: Diagnosis not present

## 2022-05-14 DIAGNOSIS — M50122 Cervical disc disorder at C5-C6 level with radiculopathy: Secondary | ICD-10-CM | POA: Diagnosis not present

## 2022-05-17 DIAGNOSIS — M9904 Segmental and somatic dysfunction of sacral region: Secondary | ICD-10-CM | POA: Diagnosis not present

## 2022-05-17 DIAGNOSIS — M9903 Segmental and somatic dysfunction of lumbar region: Secondary | ICD-10-CM | POA: Diagnosis not present

## 2022-05-17 DIAGNOSIS — M9901 Segmental and somatic dysfunction of cervical region: Secondary | ICD-10-CM | POA: Diagnosis not present

## 2022-05-17 DIAGNOSIS — M50122 Cervical disc disorder at C5-C6 level with radiculopathy: Secondary | ICD-10-CM | POA: Diagnosis not present

## 2022-05-21 DIAGNOSIS — M50122 Cervical disc disorder at C5-C6 level with radiculopathy: Secondary | ICD-10-CM | POA: Diagnosis not present

## 2022-05-21 DIAGNOSIS — M9904 Segmental and somatic dysfunction of sacral region: Secondary | ICD-10-CM | POA: Diagnosis not present

## 2022-05-21 DIAGNOSIS — M9903 Segmental and somatic dysfunction of lumbar region: Secondary | ICD-10-CM | POA: Diagnosis not present

## 2022-05-21 DIAGNOSIS — M9901 Segmental and somatic dysfunction of cervical region: Secondary | ICD-10-CM | POA: Diagnosis not present

## 2022-05-24 DIAGNOSIS — M50122 Cervical disc disorder at C5-C6 level with radiculopathy: Secondary | ICD-10-CM | POA: Diagnosis not present

## 2022-05-24 DIAGNOSIS — M9901 Segmental and somatic dysfunction of cervical region: Secondary | ICD-10-CM | POA: Diagnosis not present

## 2022-05-24 DIAGNOSIS — M9903 Segmental and somatic dysfunction of lumbar region: Secondary | ICD-10-CM | POA: Diagnosis not present

## 2022-05-24 DIAGNOSIS — M9904 Segmental and somatic dysfunction of sacral region: Secondary | ICD-10-CM | POA: Diagnosis not present

## 2022-05-29 DIAGNOSIS — M50122 Cervical disc disorder at C5-C6 level with radiculopathy: Secondary | ICD-10-CM | POA: Diagnosis not present

## 2022-05-29 DIAGNOSIS — M9901 Segmental and somatic dysfunction of cervical region: Secondary | ICD-10-CM | POA: Diagnosis not present

## 2022-05-29 DIAGNOSIS — M9903 Segmental and somatic dysfunction of lumbar region: Secondary | ICD-10-CM | POA: Diagnosis not present

## 2022-05-29 DIAGNOSIS — M9904 Segmental and somatic dysfunction of sacral region: Secondary | ICD-10-CM | POA: Diagnosis not present

## 2022-05-31 DIAGNOSIS — M9901 Segmental and somatic dysfunction of cervical region: Secondary | ICD-10-CM | POA: Diagnosis not present

## 2022-05-31 DIAGNOSIS — M9904 Segmental and somatic dysfunction of sacral region: Secondary | ICD-10-CM | POA: Diagnosis not present

## 2022-05-31 DIAGNOSIS — M9903 Segmental and somatic dysfunction of lumbar region: Secondary | ICD-10-CM | POA: Diagnosis not present

## 2022-05-31 DIAGNOSIS — M50122 Cervical disc disorder at C5-C6 level with radiculopathy: Secondary | ICD-10-CM | POA: Diagnosis not present

## 2022-06-04 DIAGNOSIS — M9904 Segmental and somatic dysfunction of sacral region: Secondary | ICD-10-CM | POA: Diagnosis not present

## 2022-06-04 DIAGNOSIS — M50122 Cervical disc disorder at C5-C6 level with radiculopathy: Secondary | ICD-10-CM | POA: Diagnosis not present

## 2022-06-04 DIAGNOSIS — M9903 Segmental and somatic dysfunction of lumbar region: Secondary | ICD-10-CM | POA: Diagnosis not present

## 2022-06-04 DIAGNOSIS — M9901 Segmental and somatic dysfunction of cervical region: Secondary | ICD-10-CM | POA: Diagnosis not present

## 2022-06-11 DIAGNOSIS — M9903 Segmental and somatic dysfunction of lumbar region: Secondary | ICD-10-CM | POA: Diagnosis not present

## 2022-06-11 DIAGNOSIS — M50122 Cervical disc disorder at C5-C6 level with radiculopathy: Secondary | ICD-10-CM | POA: Diagnosis not present

## 2022-06-11 DIAGNOSIS — M9901 Segmental and somatic dysfunction of cervical region: Secondary | ICD-10-CM | POA: Diagnosis not present

## 2022-06-11 DIAGNOSIS — M9904 Segmental and somatic dysfunction of sacral region: Secondary | ICD-10-CM | POA: Diagnosis not present

## 2022-06-14 ENCOUNTER — Ambulatory Visit (INDEPENDENT_AMBULATORY_CARE_PROVIDER_SITE_OTHER): Payer: Medicare HMO | Admitting: Family Medicine

## 2022-06-14 VITALS — BP 130/78 | HR 90 | Temp 98.8°F | Wt 218.4 lb

## 2022-06-14 DIAGNOSIS — Z91148 Patient's other noncompliance with medication regimen for other reason: Secondary | ICD-10-CM

## 2022-06-14 DIAGNOSIS — E785 Hyperlipidemia, unspecified: Secondary | ICD-10-CM

## 2022-06-14 DIAGNOSIS — I10 Essential (primary) hypertension: Secondary | ICD-10-CM

## 2022-06-14 DIAGNOSIS — E291 Testicular hypofunction: Secondary | ICD-10-CM

## 2022-06-14 DIAGNOSIS — Z1159 Encounter for screening for other viral diseases: Secondary | ICD-10-CM

## 2022-06-14 DIAGNOSIS — E1169 Type 2 diabetes mellitus with other specified complication: Secondary | ICD-10-CM | POA: Diagnosis not present

## 2022-06-14 DIAGNOSIS — E1165 Type 2 diabetes mellitus with hyperglycemia: Secondary | ICD-10-CM

## 2022-06-14 DIAGNOSIS — N489 Disorder of penis, unspecified: Secondary | ICD-10-CM | POA: Diagnosis not present

## 2022-06-14 LAB — COMPREHENSIVE METABOLIC PANEL
ALT: 36 U/L (ref 0–53)
AST: 31 U/L (ref 0–37)
Albumin: 4.6 g/dL (ref 3.5–5.2)
Alkaline Phosphatase: 88 U/L (ref 39–117)
BUN: 14 mg/dL (ref 6–23)
CO2: 27 mEq/L (ref 19–32)
Calcium: 10.5 mg/dL (ref 8.4–10.5)
Chloride: 104 mEq/L (ref 96–112)
Creatinine, Ser: 1.01 mg/dL (ref 0.40–1.50)
GFR: 76.6 mL/min (ref >60.00)
Glucose, Bld: 160 mg/dL — ABNORMAL HIGH (ref 70–99)
Potassium: 4.8 mEq/L (ref 3.5–5.1)
Sodium: 141 mEq/L (ref 135–145)
Total Bilirubin: 0.6 mg/dL (ref 0.2–1.2)
Total Protein: 7.2 g/dL (ref 6.0–8.3)

## 2022-06-14 LAB — TESTOSTERONE: Testosterone: 230 ng/dL — ABNORMAL LOW (ref 300.00–890.00)

## 2022-06-14 LAB — LDL CHOLESTEROL, DIRECT: Direct LDL: 212 mg/dL

## 2022-06-14 LAB — LIPID PANEL
Cholesterol: 288 mg/dL — ABNORMAL HIGH (ref 0–200)
HDL: 40 mg/dL (ref >39.00)
NonHDL: 248.31
Total CHOL/HDL Ratio: 7
Triglycerides: 212 mg/dL — ABNORMAL HIGH (ref 0.0–149.0)
VLDL: 42.4 mg/dL — ABNORMAL HIGH (ref 0.0–40.0)

## 2022-06-14 LAB — MICROALBUMIN / CREATININE URINE RATIO
Creatinine,U: 244.1 mg/dL
Microalb Creat Ratio: 0.7 mg/g (ref 0.0–30.0)
Microalb, Ur: 1.8 mg/dL (ref 0.0–1.9)

## 2022-06-14 LAB — HEMOGLOBIN A1C: Hgb A1c MFr Bld: 8.1 % — ABNORMAL HIGH (ref 4.6–6.5)

## 2022-06-14 NOTE — Patient Instructions (Addendum)
I will check your labs and we will discuss options at follow up in next few weeks.  Keep up the good work with diet/exercise. No blood pressure med changes today.  Try to take cholesterol med daily - place it next to your other med. Will check labs today and again once you are taking that med more consistently.  Rash on inner penis could be fungal infection again but I will refer you to urologist.  Have checked your testosterone level and can discuss testosterone treatment with urology as well.  Apply the clotrimazole antifungal cream twice per day for now.  Recheck in 2 weeks.

## 2022-06-14 NOTE — Progress Notes (Signed)
Subjective:  Patient ID: David Dalton, male    DOB: Nov 21, 1955  Age: 67 y.o. MRN: [id number]  CC:  Chief Complaint  Patient presents with   Labs Only    Pt requesting Hep C repeat notes he has been screening for this previously however he has seen some concerning articles and ads about this    Diabetes    Pt agreeable to urine today as well as foot check    Referral    Pt has struggled with rash on the genital area off and on for years, last saw Dr Birdie Riddle for this concern he is now requesting to see a urologist in hope of resolving this issue. 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  Last visit in July 2023 diet controlled diabetes previously, gabapentin 300 mg at bedtime for neuropathy, episodic use at his last visit.  Elevated A1c of 7.9 in July of last year, recommended starting metformin, and continued on Lipitor.  Note received after discussion with patient from medical staff that he was not interested in using metformin and inconsistent use of statin.  Has changed eating habits, more exercise, lost weight.  No home blood sugar readings. Has meter.   Microalbumin: Unable to provide specimen last year, normal in May 2022 Optho, foot exam, pneumovax: Up-to-date, except ophthalmology -within a year - Dr. Marin [facility].  Foot exam last July.   Lab Results  Component Value Date   HGBA1C 7.9 (H) 11/06/2021   HGBA1C 7.1 (A) 04/20/2021   HGBA1C 7.8 (H) 08/15/2020   Lab Results  Component Value Date   MICROALBUR 1.2 08/15/2020   CREATININE 1.00 11/06/2021   Wt Readings from Last 3 Encounters:  06/14/22 218 lb 6.4 oz (99.1 kg)  01/18/22 221 lb (100.2 kg)  11/06/21 220 lb 6.4 oz (100 kg)     Screening for hepatitis C Reports previous negative testing, not available by EHR.  Repeat planned today.   Genital rash: reports recurrent genital rash for  years.  Most recently treated by my colleague on 07/18/2021,  possible prior fungal rash, treated with clotrimazole. Same rash as in past - flare currently - past 2 weeks, mild, on end of penis.  Clotrimazole only used 1-2 times per week.  Hx of low testosterone - not on replacement.   Lab Results  Component Value Date   TESTOSTERONE 275.00 (L) 08/15/2020     Hypertension: Lisinopril 10 mg daily, new generic supplier. Doing ok recently. More exercise and improved eating habits.  Home readings: 130-135/75-80.  BP Readings from Last 3 Encounters:  06/14/22 130/78  11/06/21 132/78  07/18/21 134/80   Lab Results  Component Value Date   CREATININE 1.00 11/06/2021    Hyperlipidemia: Lipitor 10 mg daily Rx - only takes once per week. No side effects.  Forgets to take at times.  Last ate last night.  Lab Results  Component Value Date   CHOL 296 (H) 11/06/2021   HDL 37.40 (L) 11/06/2021   LDLDIRECT 202.0 11/06/2021   TRIG 281.0 (H) 11/06/2021   CHOLHDL 8 11/06/2021   Lab Results  Component Value Date   ALT 32 11/06/2021   AST 31 11/06/2021   ALKPHOS 82 11/06/2021   BILITOT 0.5 11/06/2021      History Patient Active Problem List   Diagnosis Date Noted   Nonspecific abnormal electrocardiogram (ECG) (EKG) 07/10/2021  Preop cardiovascular exam 07/10/2021   Mixed hyperlipidemia 07/10/2021   New onset type 2 diabetes mellitus ([place]) 05/20/2018   Anxiety state 05/13/2018   Diastasis recti 05/13/2018   Benign essential hypertension 11/16/2013   OBESITY 07/25/2009   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 Authorizing Provider  atorvastatin (LIPITOR) 10 MG tablet Take 1 tablet (10 mg total) by mouth daily. 11/06/21   Wendie Agreste, MD  Cholecalciferol (VITAMIN D3) 250 MCG (10000 UT) TABS Take 1 tablet by mouth daily.    [provider]  clotrimazole (LOTRIMIN) 1 % cream Apply 1 application topically 2 (two) times daily. As needed for genital rash. 05/25/21   Wendie Agreste, MD  docusate sodium (COLACE) 100 MG capsule Take 100 mg by mouth 2 (two) times daily.    [provider]  gabapentin (NEURONTIN) 300 MG capsule TAKE 1 CAPSULE BY MOUTH AT BEDTIME AS NEEDED 11/06/21   Wendie Agreste, MD  lisinopril (ZESTRIL) 10 MG tablet Take 1 tablet (10 mg total) by mouth daily. 11/06/21   Wendie Agreste, MD   Social History   Socioeconomic History   Marital status: Married    Spouse name: Not on file   Number of children: Not on file   Years of education: Not on file   Highest education level: Not on file  Occupational History   Not on file  Tobacco Use   Smoking status: Never   Smokeless tobacco: Never  Vaping Use   Vaping Use: Never used  Substance and Sexual Activity   Alcohol use: Not Currently   Drug use: Not Currently   Sexual activity: Yes  Other Topics Concern   Not on file  Social History Narrative   Not on file   Social Determinants of Health   Financial Resource Strain: Low Risk  (12/29/2020)   Overall Financial Resource Strain (CARDIA)    Difficulty of Paying Living Expenses: Not hard at all  Food Insecurity: No Food Insecurity (01/18/2022)   Hunger Vital Sign    Worried About Running Out of Food in the Last Year: Never true    [place] in the Last Year: Never true  Transportation Needs: No Transportation Needs (01/18/2022)   PRAPARE - Hydrologist (Medical): No    Lack of Transportation (Non-Medical): No  Physical Activity: Insufficiently Active (01/18/2022)   Exercise Vital Sign    Days of Exercise  per Week: 3 days    Minutes of Exercise per Session: 30 min  Stress: No Stress Concern Present (01/18/2022)   [place]    Feeling of Stress : Not at all  Social Connections: Moderately Isolated (01/18/2022)   Social Connection and Isolation Panel [NHANES]    Frequency of Communication with Friends and Family: More than three times a week    Frequency of Social Gatherings with Friends and Family: More than three times a week    Attends Religious Services: Never    Marine scientist or Organizations: Yes    Attends  Club or Organization Meetings: More than 4 times per year    Marital Status: Divorced  Intimate Partner Violence: Not At Risk (01/18/2022)   Humiliation, Afraid, Rape, and Kick questionnaire    Fear of Current or Ex-Partner: No    Emotionally Abused: No    Physically Abused: No    Sexually Abused: No    Review of Systems Per HPI  Objective:   Vitals:   06/14/22 1032  BP: 130/78  Pulse: 90  Temp: 98.8 F (37.1 C)  TempSrc: Temporal  SpO2: 95%  Weight: 218 lb 6.4 oz (99.1 kg)     Physical Exam Vitals reviewed.  Constitutional:      Appearance: He is well-developed.  HENT:     Head: Normocephalic and atraumatic.  Neck:     Vascular: No carotid bruit or JVD.  Cardiovascular:     Rate and Rhythm: Normal rate and regular rhythm.     Heart sounds: Normal heart sounds. No murmur heard. Pulmonary:     Effort: Pulmonary effort is normal.     Breath sounds: Normal breath sounds. No rales.  Genitourinary:    Comments: Slight hyperpigmentation, inguinal folds, erythematous patch distal penile head.  No penile discharge.  No vesicles. Musculoskeletal:     Right lower leg: No edema.     Left lower leg: No edema.  Skin:    General: Skin is warm and dry.  Neurological:     Mental Status: He is alert and oriented to person, place, and time.  Psychiatric:        Mood and Affect: Mood normal.       Assessment & Plan:  David Dalton is a 67 y.o. male . Type 2 diabetes mellitus with hyperglycemia, without long-term current use of insulin (HCC) - Plan: Hemoglobin A1c, Microalbumin / creatinine urine ratio, Comprehensive metabolic panel  -Check labs with medication adjustment accordingly.  Has tried to improve diet, exercise.  Commended on positive health changes.  Need for hepatitis C screening test - Plan: Hepatitis C Antibody  Benign essential hypertension  -Stable, continue positive health changes as above, same dose lisinopril.  Hyperlipidemia associated with type 2 diabetes mellitus ([place]) - Plan: Lipid panel Nonadherence to medication - Plan: Lipid panel  -Tolerating statin but infrequent dosing, adherence.  Stressed importance of daily dosing, check updated labs with adjustments accordingly.  Hypogonadism male - Plan: Ambulatory referral to Urology, Testosterone  -Repeat testosterone level, will be referred to urology for penile lesion as well, can discuss treatment options if still low testosterone at that time.  Penile lesion - Plan: Ambulatory referral to Urology  -Recurrent lesion, appears to be possible fungal lesion but will refer to urology given recurrence.  May need biopsy if not resolving with treatment.  RTC precautions if acute worsening.  No orders of the defined types were placed in this encounter.  Patient Instructions  I will check your labs and we will discuss options at follow up in next few weeks.  Keep up the good work with diet/exercise. No blood pressure med changes today.  Try to take cholesterol med daily - place it next to your other med. Will check labs today and again once you are taking that med more consistently.  Rash on inner penis could be fungal infection again but I will refer you to urologist.  Have checked your testosterone level and can discuss testosterone treatment with urology as well.  Apply the clotrimazole antifungal cream twice  per day for now.  Recheck in 2 weeks.     Signed,   Merri Ray, MD [place] 06/14/22 11:11 AM

## 2022-06-15 LAB — HEPATITIS C ANTIBODY: Hepatitis C Ab: NONREACTIVE

## 2022-06-17 ENCOUNTER — Encounter: Payer: Self-pay | Admitting: Family Medicine

## 2022-06-28 ENCOUNTER — Ambulatory Visit: Payer: Medicare HMO | Admitting: Family Medicine

## 2022-07-04 ENCOUNTER — Encounter: Payer: Self-pay | Admitting: Urology

## 2022-07-04 ENCOUNTER — Ambulatory Visit: Payer: Medicare HMO | Admitting: Urology

## 2022-07-04 VITALS — BP 156/80 | HR 74

## 2022-07-04 DIAGNOSIS — E291 Testicular hypofunction: Secondary | ICD-10-CM

## 2022-07-04 DIAGNOSIS — N489 Disorder of penis, unspecified: Secondary | ICD-10-CM

## 2022-07-04 LAB — URINALYSIS, ROUTINE W REFLEX MICROSCOPIC
Bilirubin, UA: NEGATIVE
Glucose, UA: NEGATIVE
Ketones, UA: NEGATIVE
Leukocytes,UA: NEGATIVE
Nitrite, UA: NEGATIVE
Protein,UA: NEGATIVE
RBC, UA: NEGATIVE
Specific Gravity, UA: 1.025 (ref 1.005–1.030)
Urobilinogen, Ur: 0.2 mg/dL (ref 0.2–1.0)
pH, UA: 6 (ref 5.0–7.5)

## 2022-07-04 MED ORDER — CLOTRIMAZOLE-BETAMETHASONE 1-0.05 % EX CREA: 1.0000 | TOPICAL_CREAM | Freq: Two times a day (BID) | CUTANEOUS | 0 refills | Status: AC

## 2022-07-04 MED ORDER — TESTOSTERONE 20.25 MG/1.25GM (1.62%) TD GEL: 2.0000 | Freq: Every day | TRANSDERMAL | 3 refills | Status: AC

## 2022-07-04 NOTE — Patient Instructions (Signed)
Balanitis ? ?Balanitis is swelling and irritation of the head of the penis (glans penis). Balanitis occurs most often among males who have not had their foreskin removed (uncircumcised). In uncircumcised males, the condition may also cause inflammation of the skin around the foreskin. ?Balanitis sometimes causes scarring of the penis or foreskin, which can require surgery. This condition may develop because of an infection or another medical condition. Untreated balanitis can increase the risk of penile cancer. ?What are the causes? ?Common causes of this condition include: ?Irritation and lack of airflow due to fluid (smegma) that can build up on the glans penis. ?Poor personal hygiene, especially in uncircumcised males. Not cleaning the glans penis and foreskin well can result in a buildup of bacteria, viruses, and yeast, which can lead to infection and inflammation. ?Other causes include: ?Chemical irritation from products such as soaps or shower gels, especially those that have fragrance. Chemical irritation can also be caused by condoms, personal lubricants, petroleum jelly, spermicides, fabric softeners, or laundry detergents. ?Skin conditions, such as eczema, dermatitis, and psoriasis. ?Allergies to medicines, such as tetracycline and sulfa drugs. ?What increases the risk? ?The following factors may make you more likely to develop this condition: ?Being an uncircumcised male. ?Having diabetes. ?Having other medical conditions, including liver cirrhosis, congestive heart failure, or kidney disease. ?Having infections, such as candidiasis, HPV (human papillomavirus), herpes simplex, gonorrhea, or syphilis. ?Having a tight foreskin that is difficult to pull back (retract) past the glans penis. ?Being severely obese. ?History of reactive arthritis. ?What are the signs or symptoms? ?Symptoms of this condition include: ?Discharge from under the foreskin, and pain or difficulty retracting the foreskin. ?A bad smell  or itchiness on the penis. ?Tenderness, redness, and swelling of the glans penis. ?A rash or sores on the glans penis or foreskin. ?Inability to get an erection due to pain. ?Trouble urinating. ?Scarring of the penis or foreskin, in some cases. ?How is this diagnosed? ?This condition may be diagnosed based on a physical exam and tests of a swab of discharge to check for bacterial or fungal infection. ?You may also have blood tests to check for: ?Viruses that can cause balanitis. ?A high blood sugar (glucose) level. This could be a sign of diabetes, which can increase the risk of balanitis. ?How is this treated? ?Treatment for this condition depends on the cause. Treatment may include: ?Improving personal hygiene. Your health care provider may recommend sitting in a bath of warm water that is deep enough to cover your hips and buttocks (sitz bath). ?Medicines such as: ?Creams or ointments to reduce swelling (steroids) or to treat an infection. ?Antibiotic medicine. ?Antifungal medicine. ?Having surgery to remove or cut the foreskin (circumcision). This may be done if you have scarring on the foreskin that makes it difficult to retract. ?Controlling other medical problems that may be causing your condition or making it worse. ?Follow these instructions at home: ?Medicines ?Take over-the-counter and prescription medicines only as told by your health care provider. ?If you were prescribed an antibiotic medicine, use it as told by your health care provider. Do not stop using the antibiotic even if you start to feel better. ?General instructions ?Do not have sex until the condition clears up, or until your health care provider approves. ?Keep your penis clean and dry. Take sitz baths as recommended by your health care provider. ?Avoid products that irritate your skin or make symptoms worse, such as soaps and shower gels that have fragrance. ?Keep all follow-up visits. This is   important. ?Contact a health care provider  if: ?Your symptoms get worse or do not improve with home care. ?You develop chills or a fever. ?You have trouble urinating. ?You cannot retract your foreskin. ?Get help right away if: ?You develop severe pain. ?You are unable to urinate. ?Summary ?Balanitis is swelling and irritation of the head of the penis (glans penis). This condition is most common among uncircumcised males. ?Balanitis causes pain, redness, and swelling of the glans penis. ?Good personal hygiene is important. ?Treatment may include improving personal hygiene and applying creams or ointments. ?Contact a health care provider if your symptoms get worse or do not improve with home care. ?This information is not intended to replace advice given to you by your health care provider. Make sure you discuss any questions you have with your health care provider. ?Document Revised: 09/14/2020 Document Reviewed: 09/14/2020 ?Elsevier Patient Education ? 2023 Elsevier Inc. ? ?

## 2022-07-04 NOTE — Progress Notes (Signed)
07/04/2022 10:16 AM   David Dalton 1955-10-19 [id number]  Referring provider: Wendie Agreste, MD [address] [place],  [place] [address]  Hypogonadism and penile rash    HPI: David Dalton is a 67yo here for evaluation of hypogonadism and penile rash. He developed a rash over 1 month ago which improved with clotrimazole cream. He he issues with low testosterone and was previously on IM injections for 3-4 years. He has also been on androgel and testim. He has been off the IM testosterone for several years. Testosterone has been 195-270 over the past 3 years. He has fatigue and decreased libido. He has issues with peyronies which does no bother him.    PMH: Past Medical History:  Diagnosis Date   Anxiety    History of chickenpox    Hypertension     Surgical History: Past Surgical History:  Procedure Laterality Date   FINGER SURGERY      Home Medications:  Allergies as of 07/04/2022   No Known Allergies      Medication List        Accurate as of July 04, 2022 10:16 AM. If you have any questions, ask your nurse or doctor.          atorvastatin 10 MG tablet Commonly known as: LIPITOR Take 1 tablet (10 mg total) by mouth daily.   clotrimazole 1 % cream Commonly known as: LOTRIMIN Apply 1 application topically 2 (two) times daily. As needed for genital rash.   docusate sodium 100 MG capsule Commonly known as: COLACE Take 100 mg by mouth 2 (two) times daily.   gabapentin 300 MG capsule Commonly known as: NEURONTIN TAKE 1 CAPSULE BY MOUTH AT BEDTIME AS NEEDED   lisinopril 10 MG tablet Commonly known as: ZESTRIL Take 1 tablet (10 mg total) by mouth daily.   Vitamin D3 250 MCG (10000 UT) Tabs Generic drug: Cholecalciferol Take 1 tablet by mouth daily.        Allergies: No Known Allergies  Family History: Family History  Problem Relation Age of Onset   Cancer Mother    Anxiety disorder Mother    Diabetes Mother    Alcohol abuse Father     Heart disease Father    Heart attack Father 82   Cancer Sister    Diabetes Brother    Cancer Sister    Cancer Sister     Social History:  reports that he has never smoked. He has never used smokeless tobacco. He reports that he does not currently use alcohol. He reports that he does not currently use drugs.  ROS: All other review of systems were reviewed and are negative except what is noted above in HPI  Physical Exam: BP (!) 156/80   Pulse 74   Constitutional:  Alert and oriented, No acute distress. HEENT: [place] AT, moist mucus membranes.  Trachea midline, no masses. Cardiovascular: No clubbing, cyanosis, or edema. Respiratory: Normal respiratory effort, no increased work of breathing. GI: Abdomen is soft, nontender, nondistended, no abdominal masses GU: No CVA tenderness. Uncircumcised phallus. Balanitis present. No masses/lesions on penis, testis, scrotum. Prostate Lymph: No cervical or inguinal lymphadenopathy. Skin: No rashes, bruises or suspicious lesions. Neurologic: Grossly intact, no focal deficits, moving all 4 extremities. Psychiatric: Normal mood and affect.  Laboratory Data: Lab Results  Component Value Date   WBC 9.0 05/25/2021   HGB 15.6 05/25/2021   HCT 46.5 05/25/2021   MCV 86.1 05/25/2021   PLT 311.0 05/25/2021  Lab Results  Component Value Date   CREATININE 1.01 06/14/2022    No results found for: "PSA"  Lab Results  Component Value Date   TESTOSTERONE 230.00 (L) 06/14/2022    Lab Results  Component Value Date   HGBA1C 8.1 (H) 06/14/2022    Urinalysis    Component Value Date/Time   BILIRUBINUR negative 09/17/2019 1131   PROTEINUR Negative 09/17/2019 1131   UROBILINOGEN 0.2 09/17/2019 1131   NITRITE negative 09/17/2019 1131   LEUKOCYTESUR Negative 09/17/2019 1131    No results found for: "LABMICR", "WBCUA", "RBCUA", "LABEPIT", "MUCUS", "BACTERIA"  Pertinent Imaging:  No results found for this or any previous visit.  No results  found for this or any previous visit.  No results found for this or any previous visit.  No results found for this or any previous visit.  No results found for this or any previous visit.  No valid procedures specified. No results found for this or any previous visit.  No results found for this or any previous visit.   Assessment & Plan:    1. Hypogonadism in male -we will start androgel 2 packets daily -followup 3 months with testosterone labs  2. Penile lesion -clotrimazole-betamethasone - Urinalysis, Routine w reflex microscopic   No follow-ups on file.  Nicolette Bang, MD  [facility] [place]

## 2022-07-26 ENCOUNTER — Telehealth: Payer: Self-pay

## 2022-07-26 ENCOUNTER — Ambulatory Visit (INDEPENDENT_AMBULATORY_CARE_PROVIDER_SITE_OTHER): Payer: Medicare HMO | Admitting: Urology

## 2022-07-26 DIAGNOSIS — N390 Urinary tract infection, site not specified: Secondary | ICD-10-CM

## 2022-07-26 LAB — URINALYSIS, ROUTINE W REFLEX MICROSCOPIC
Bilirubin, UA: NEGATIVE
Glucose, UA: NEGATIVE
Ketones, UA: NEGATIVE
Nitrite, UA: POSITIVE — AB
Specific Gravity, UA: 1.025 (ref 1.005–1.030)
Urobilinogen, Ur: 1 mg/dL (ref 0.2–1.0)
pH, UA: 5.5 (ref 5.0–7.5)

## 2022-07-26 LAB — MICROSCOPIC EXAMINATION
RBC, Urine: >30 /hpf — AB (ref 0–2)
WBC, UA: >30 /hpf — AB (ref 0–5)

## 2022-07-26 MED ORDER — NITROFURANTOIN MONOHYD MACRO 100 MG PO CAPS: 100.0000 mg | ORAL_CAPSULE | Freq: Two times a day (BID) | ORAL | 0 refills | Status: AC

## 2022-07-26 NOTE — Progress Notes (Signed)
Patient presents today with complaints of  UTI.  UA and Culture done today.  Dr. Annabell Howells reviewed results and Macrobid BID #14 started today .  Patient aware of MD recommendations and that we will reach out with culture results.      [id number], CMA

## 2022-07-26 NOTE — Telephone Encounter (Signed)
Called patient making him aware that his urinalysis was positive for infection and an abt  Macrobid 100 mg bid  X 7 days which was sent to his pharmacy. Patient voiced understanding

## 2022-07-31 ENCOUNTER — Telehealth: Payer: Self-pay

## 2022-07-31 LAB — URINE CULTURE

## 2022-08-02 ENCOUNTER — Telehealth: Payer: Self-pay

## 2022-08-02 NOTE — Telephone Encounter (Signed)
Called patient to inform him of results.  I left a voicemail requesting a call back for results and to confirm repeat ua/uc on 05/09 recommended by MD.  Waiting for call back.

## 2022-08-02 NOTE — Telephone Encounter (Signed)
Patient returned call and notified of Dr. Annabell Howells request to obtain ua/uc in 2-3 weeks. Patient given to patient and voiced understanding to finish macrobid.  Patient voiced understanding.

## 2022-08-02 NOTE — Telephone Encounter (Signed)
-----   Message from Bjorn Pippin, MD sent at 08/02/2022  8:58 AM EDT ----- Doreatha Martin macrobid.   Repeat UA in 2-3 weeks with reflex to culture.   ----- Message ----- From: Grier Rocher, CMA Sent: 08/01/2022  11:22 AM EDT To: Bjorn Pippin, MD  Please review, pt started on Macrobid on 04/11

## 2022-08-08 ENCOUNTER — Other Ambulatory Visit: Payer: Self-pay | Admitting: Family Medicine

## 2022-08-08 DIAGNOSIS — I10 Essential (primary) hypertension: Secondary | ICD-10-CM

## 2022-08-09 NOTE — Telephone Encounter (Signed)
Opened in error

## 2022-08-10 DIAGNOSIS — Z96641 Presence of right artificial hip joint: Secondary | ICD-10-CM | POA: Diagnosis not present

## 2022-08-23 ENCOUNTER — Telehealth: Payer: Self-pay

## 2022-08-23 ENCOUNTER — Ambulatory Visit (INDEPENDENT_AMBULATORY_CARE_PROVIDER_SITE_OTHER): Payer: Medicare HMO

## 2022-08-23 DIAGNOSIS — N390 Urinary tract infection, site not specified: Secondary | ICD-10-CM | POA: Diagnosis not present

## 2022-08-23 LAB — URINALYSIS, ROUTINE W REFLEX MICROSCOPIC
Bilirubin, UA: NEGATIVE
Glucose, UA: NEGATIVE
Ketones, UA: NEGATIVE
Leukocytes,UA: NEGATIVE
Nitrite, UA: NEGATIVE
Protein,UA: NEGATIVE
RBC, UA: NEGATIVE
Specific Gravity, UA: 1.02 (ref 1.005–1.030)
Urobilinogen, Ur: 1 mg/dL (ref 0.2–1.0)
pH, UA: 6 (ref 5.0–7.5)

## 2022-08-23 NOTE — Progress Notes (Addendum)
Patient presents today with repeat urine drop off per Dr. Annabell Howells .  UA done today.  Dr. Annabell Howells reviewed results and no treatment  .  Patient aware of MD recommendations and that we will reach out with culture results.      [id number], CMA

## 2022-08-23 NOTE — Telephone Encounter (Signed)
Tried calling patient with negative urine results. Left message for return call

## 2022-08-27 ENCOUNTER — Encounter: Payer: Self-pay | Admitting: *Deleted

## 2022-08-27 ENCOUNTER — Telehealth: Payer: Self-pay

## 2022-08-27 NOTE — Progress Notes (Signed)
Called to offer [facility] retinal screening 08/29/2022  LB  SF Left message

## 2022-08-27 NOTE — Telephone Encounter (Signed)
Tried returning patient call with no answer. Left voiced message for return call

## 2022-08-27 NOTE — Telephone Encounter (Signed)
Letter sent out 

## 2022-08-27 NOTE — Telephone Encounter (Signed)
Patient called following up on a recent UA/UC. He advised he could be reached at [phone].     Thank you

## 2022-08-27 NOTE — Telephone Encounter (Signed)
Tried calling patient with no answer. Letter sent out

## 2022-08-29 LAB — HM DIABETES EYE EXAM

## 2022-09-30 ENCOUNTER — Other Ambulatory Visit: Payer: Self-pay | Admitting: Family Medicine

## 2022-09-30 DIAGNOSIS — I10 Essential (primary) hypertension: Secondary | ICD-10-CM

## 2022-09-30 IMAGING — DX DG CHEST 2V
2 series · 2 of 2 positions shown · non-contrast
Comparison: Chest two views 08/03/2008

CLINICAL DATA: Preoperative clearance for hip replacement [DATE].

EXAM:
CHEST - 2 VIEW

[chest pa]
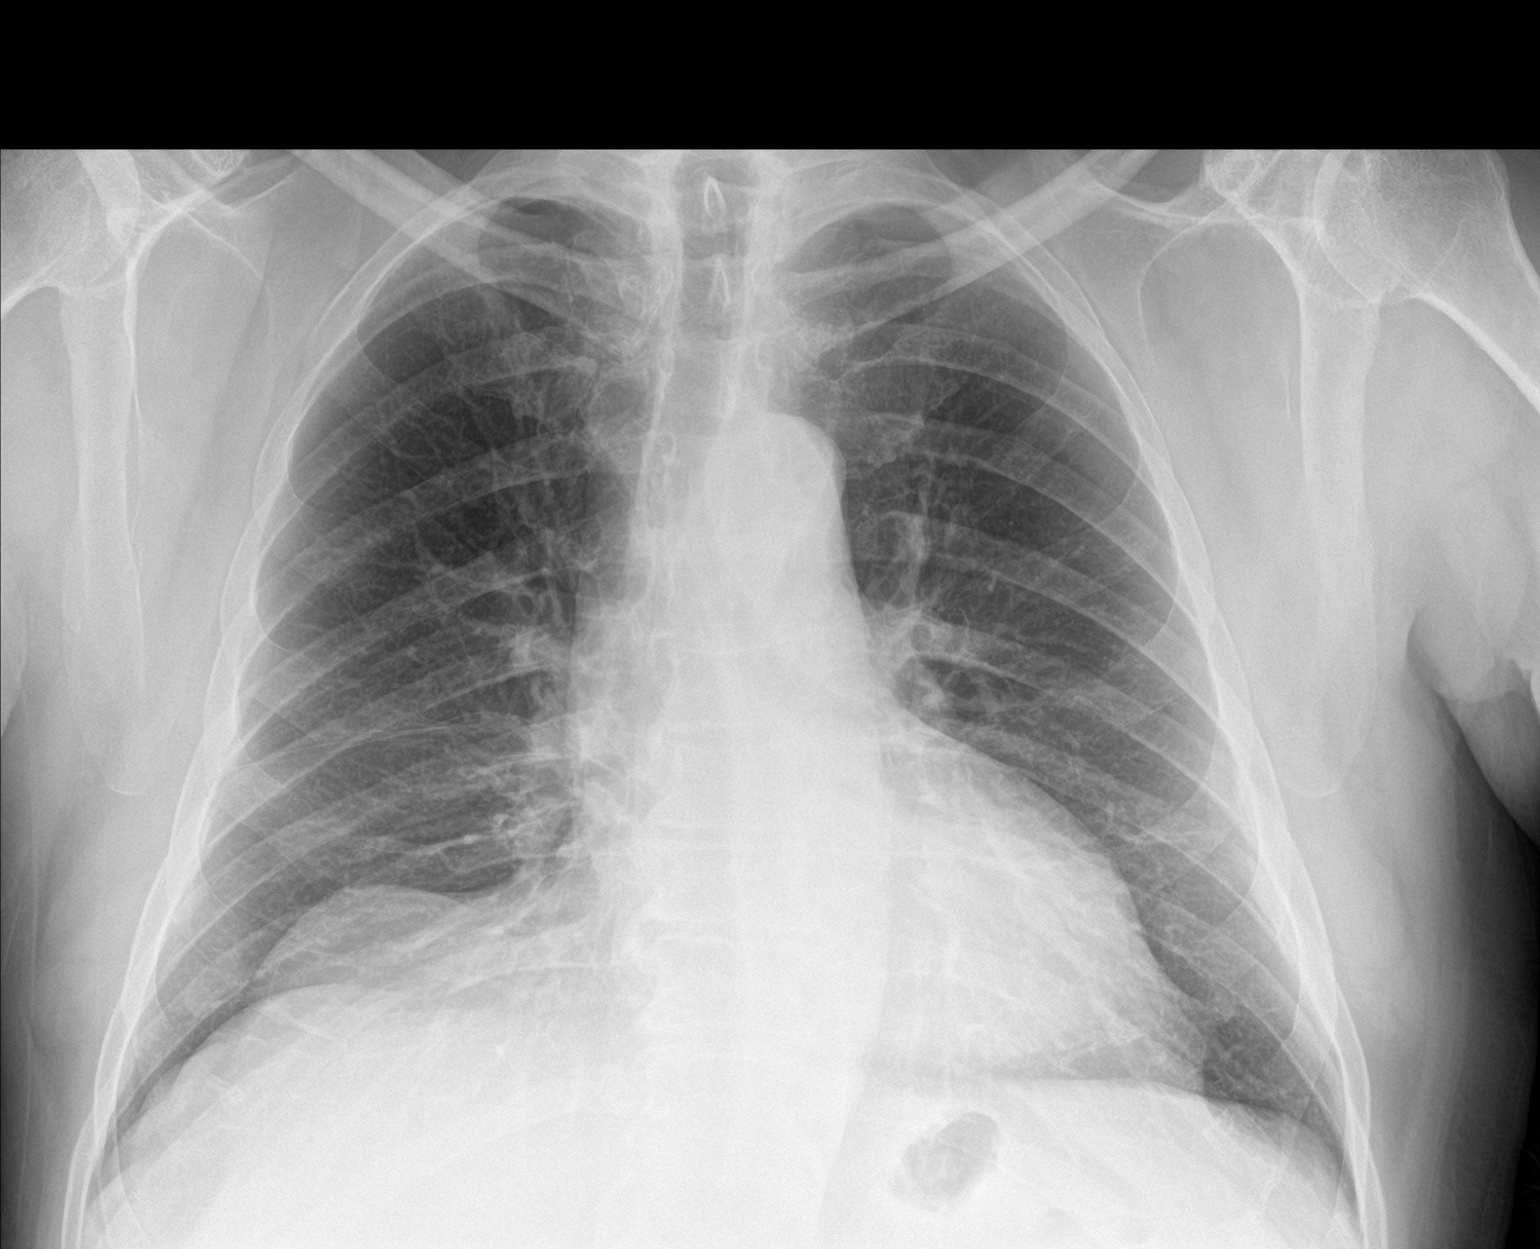

[chest lat]
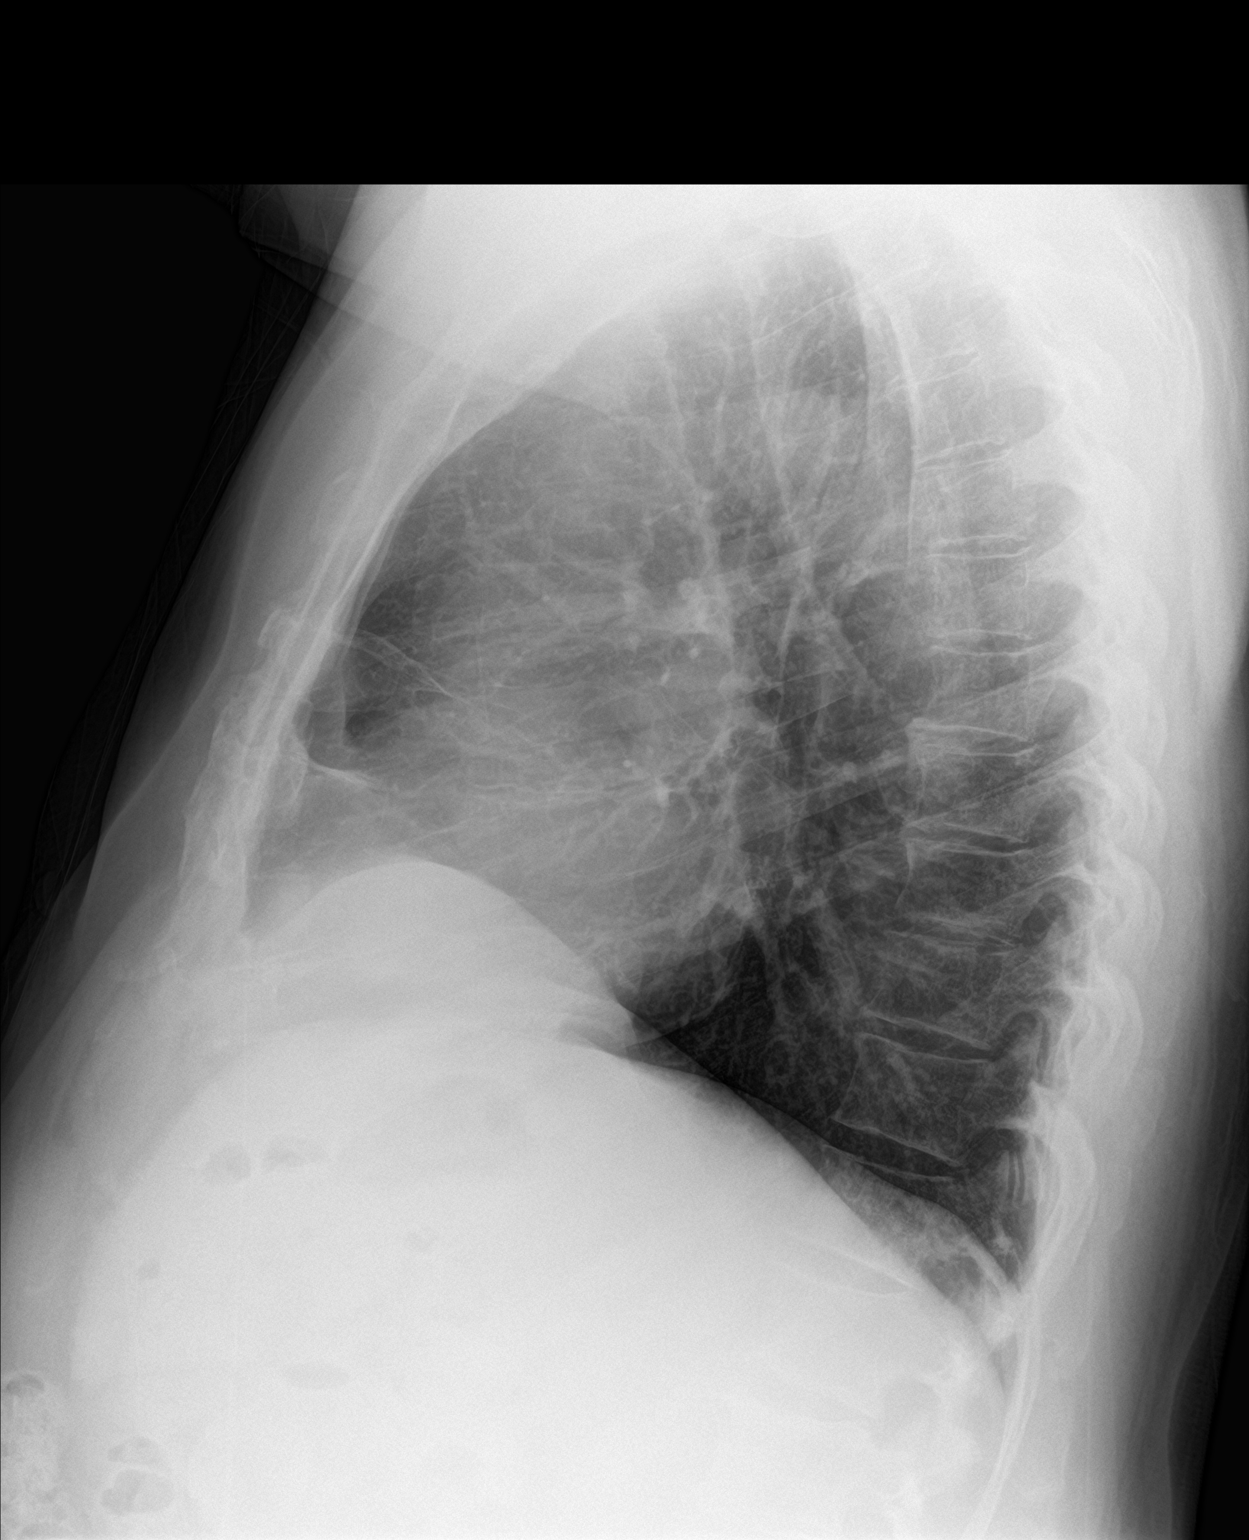

[2 of 2 positions shown; findings below may reference images not displayed]

FINDINGS: Cardiac silhouette and mediastinal contours are within normal
limits. There is again mild anterior eventration of the right
hemidiaphragm. The lungs are clear. No pleural effusion or
pneumothorax. Minimal multilevel degenerative disc changes of the
thoracic spine.
IMPRESSION: No active cardiopulmonary disease.

## 2022-10-01 ENCOUNTER — Other Ambulatory Visit: Payer: Medicare HMO

## 2022-10-01 ENCOUNTER — Telehealth: Payer: Self-pay

## 2022-10-01 NOTE — Telephone Encounter (Signed)
PA for androgel completed today, if denied no PA required for testosterone cypionate.

## 2022-10-01 NOTE — Telephone Encounter (Signed)
Patient had to cancel appointment due to testosterone medication was not filled. Pharmacy is needing a prior authorization to fill Rx.   Thank you.

## 2022-10-01 NOTE — Telephone Encounter (Signed)
Please reschedule pt lab and OV 3 months out.  PA completed today Key: [id number]

## 2022-10-08 ENCOUNTER — Ambulatory Visit: Payer: Medicare HMO | Admitting: Urology

## 2022-10-15 ENCOUNTER — Other Ambulatory Visit: Payer: Self-pay | Admitting: Family Medicine

## 2022-10-15 DIAGNOSIS — I10 Essential (primary) hypertension: Secondary | ICD-10-CM

## 2022-10-23 DIAGNOSIS — D485 Neoplasm of uncertain behavior of skin: Secondary | ICD-10-CM | POA: Diagnosis not present

## 2022-10-23 DIAGNOSIS — L821 Other seborrheic keratosis: Secondary | ICD-10-CM | POA: Diagnosis not present

## 2022-10-31 ENCOUNTER — Ambulatory Visit: Payer: Medicare HMO

## 2022-10-31 DIAGNOSIS — Z0000 Encounter for general adult medical examination without abnormal findings: Secondary | ICD-10-CM | POA: Diagnosis not present

## 2022-10-31 NOTE — Patient Instructions (Signed)
Mr. David Dalton , Thank you for taking time to come for your Medicare Wellness Visit. I appreciate your ongoing commitment to your health goals. Please review the following plan we discussed and let me know if I can assist you in the future.   Screening recommendations/referrals: Colonoscopy: check on dates Recommended yearly ophthalmology/optometry visit for glaucoma screening and checkup Recommended yearly dental visit for hygiene and checkup  Vaccinations: Influenza vaccine: ---- Pneumococcal vaccine: ---- Tdap vaccine: up to date Shingles vaccine: -----    Advanced directives: yes not on file    Preventive Care 65 Years and Older, Male Preventive care refers to lifestyle choices and visits with your health care provider that can promote health and wellness. What does preventive care include? A yearly physical exam. This is also called an annual well check. Dental exams once or twice a year. Routine eye exams. Ask your health care provider how often you should have your eyes checked. Personal lifestyle choices, including: Daily care of your teeth and gums. Regular physical activity. Eating a healthy diet. Avoiding tobacco and drug use. Limiting alcohol use. Practicing safe sex. Taking low doses of aspirin every day. Taking vitamin and mineral supplements as recommended by your health care provider. What happens during an annual well check? The services and screenings done by your health care provider during your annual well check will depend on your age, overall health, lifestyle risk factors, and family history of disease. Counseling  Your health care provider may ask you questions about your: Alcohol use. Tobacco use. Drug use. Emotional well-being. Home and relationship well-being. Sexual activity. Eating habits. History of falls. Memory and ability to understand (cognition). Work and work Astronomer. Screening  You may have the following tests or  measurements: Height, weight, and BMI. Blood pressure. Lipid and cholesterol levels. These may be checked every 5 years, or more frequently if you are over 70 years old. Skin check. Lung cancer screening. You may have this screening every year starting at age 56 if you have a 30-pack-year history of smoking and currently smoke or have quit within the past 15 years. Fecal occult blood test (FOBT) of the stool. You may have this test every year starting at age 49. Flexible sigmoidoscopy or colonoscopy. You may have a sigmoidoscopy every 5 years or a colonoscopy every 10 years starting at age 91. Prostate cancer screening. Recommendations will vary depending on your family history and other risks. Hepatitis C blood test. Hepatitis B blood test. Sexually transmitted disease (STD) testing. Diabetes screening. This is done by checking your blood sugar (glucose) after you have not eaten for a while (fasting). You may have this done every 1-3 years. Abdominal aortic aneurysm (AAA) screening. You may need this if you are a current or former smoker. Osteoporosis. You may be screened starting at age 75 if you are at high risk. Talk with your health care provider about your test results, treatment options, and if necessary, the need for more tests. Vaccines  Your health care provider may recommend certain vaccines, such as: Influenza vaccine. This is recommended every year. Tetanus, diphtheria, and acellular pertussis (Tdap, Td) vaccine. You may need a Td booster every 10 years. Zoster vaccine. You may need this after age 51. Pneumococcal 13-valent conjugate (PCV13) vaccine. One dose is recommended after age 33. Pneumococcal polysaccharide (PPSV23) vaccine. One dose is recommended after age 69. Talk to your health care provider about which screenings and vaccines you need and how often you need them. This information is not  intended to replace advice given to you by your health care provider. Make sure  you discuss any questions you have with your health care provider. Document Released: 04/29/2015 Document Revised: 12/21/2015 Document Reviewed: 02/01/2015 Elsevier Interactive Patient Education  2017 [employer].  Fall Prevention in the Home Falls can cause injuries. They can happen to people of all ages. There are many things you can do to make your home safe and to help prevent falls. What can I do on the outside of my home? Regularly fix the edges of walkways and driveways and fix any cracks. Remove anything that might make you trip as you walk through a door, such as a raised step or threshold. Trim any bushes or trees on the path to your home. Use bright outdoor lighting. Clear any walking paths of anything that might make someone trip, such as rocks or tools. Regularly check to see if handrails are loose or broken. Make sure that both sides of any steps have handrails. Any raised decks and porches should have guardrails on the edges. Have any leaves, snow, or ice cleared regularly. Use sand or salt on walking paths during winter. Clean up any spills in your garage right away. This includes oil or grease spills. What can I do in the bathroom? Use night lights. Install grab bars by the toilet and in the tub and shower. Do not use towel bars as grab bars. Use non-skid mats or decals in the tub or shower. If you need to sit down in the shower, use a plastic, non-slip stool. Keep the floor dry. Clean up any water that spills on the floor as soon as it happens. Remove soap buildup in the tub or shower regularly. Attach bath mats securely with double-sided non-slip rug tape. Do not have throw rugs and other things on the floor that can make you trip. What can I do in the bedroom? Use night lights. Make sure that you have a light by your bed that is easy to reach. Do not use any sheets or blankets that are too big for your bed. They should not hang down onto the floor. Have a firm  chair that has side arms. You can use this for support while you get dressed. Do not have throw rugs and other things on the floor that can make you trip. What can I do in the kitchen? Clean up any spills right away. Avoid walking on wet floors. Keep items that you use a lot in easy-to-reach places. If you need to reach something above you, use a strong step stool that has a grab bar. Keep electrical cords out of the way. Do not use floor polish or wax that makes floors slippery. If you must use wax, use non-skid floor wax. Do not have throw rugs and other things on the floor that can make you trip. What can I do with my stairs? Do not leave any items on the stairs. Make sure that there are handrails on both sides of the stairs and use them. Fix handrails that are broken or loose. Make sure that handrails are as long as the stairways. Check any carpeting to make sure that it is firmly attached to the stairs. Fix any carpet that is loose or worn. Avoid having throw rugs at the top or bottom of the stairs. If you do have throw rugs, attach them to the floor with carpet tape. Make sure that you have a light switch at the top of the stairs  and the bottom of the stairs. If you do not have them, ask someone to add them for you. What else can I do to help prevent falls? Wear shoes that: Do not have high heels. Have rubber bottoms. Are comfortable and fit you well. Are closed at the toe. Do not wear sandals. If you use a stepladder: Make sure that it is fully opened. Do not climb a closed stepladder. Make sure that both sides of the stepladder are locked into place. Ask someone to hold it for you, if possible. Clearly mark and make sure that you can see: Any grab bars or handrails. First and last steps. Where the edge of each step is. Use tools that help you move around (mobility aids) if they are needed. These include: Canes. Walkers. Scooters. Crutches. Turn on the lights when you go  into a dark area. Replace any light bulbs as soon as they burn out. Set up your furniture so you have a clear path. Avoid moving your furniture around. If any of your floors are uneven, fix them. If there are any pets around you, be aware of where they are. Review your medicines with your doctor. Some medicines can make you feel dizzy. This can increase your chance of falling. Ask your doctor what other things that you can do to help prevent falls. This information is not intended to replace advice given to you by your health care provider. Make sure you discuss any questions you have with your health care provider. Document Released: 01/27/2009 Document Revised: 09/08/2015 Document Reviewed: 05/07/2014 Elsevier Interactive Patient Education  2017 [employer].

## 2022-10-31 NOTE — Progress Notes (Signed)
Subjective:   David Dalton is a 67 y.o. male who presents for Medicare Annual/Subsequent preventive examination.  Visit Complete: Virtual  I connected with  Staci Righter on 10/31/22 by a audio enabled telemedicine application and verified that I am speaking with the correct person using two identifiers.  Patient Location: Home  Provider Location: Home Office  I discussed the limitations of evaluation and management by telemedicine. The patient expressed understanding and agreed to proceed.   Review of Systems     Cardiac Risk Factors include: advanced age (>41men, >59 women);male gender;hypertension;family history of premature cardiovascular disease     Objective:    Today's Vitals   There is no height or weight on file to calculate BMI.     10/31/2022    9:04 AM 01/18/2022   10:29 AM 12/29/2020    8:43 AM  Advanced Directives  Does Patient Have a Medical Advance Directive? Yes Yes No  Type of Advance Directive Living will Healthcare Power of [place];Living will   Copy of Healthcare Power of Attorney in Chart?  No - copy requested   Would patient like information on creating a medical advance directive?   No - Patient declined    Current Medications (verified) Outpatient Encounter Medications as of 10/31/2022  Medication Sig   Cholecalciferol (VITAMIN D3) 250 MCG (10000 UT) TABS Take 1 tablet by mouth daily.   clotrimazole (LOTRIMIN) 1 % cream Apply 1 application topically 2 (two) times daily. As needed for genital rash.   clotrimazole-betamethasone (LOTRISONE) cream Apply 1 Application topically 2 (two) times daily.   lisinopril (ZESTRIL) 10 MG tablet Take 1 tablet by mouth once daily   atorvastatin (LIPITOR) 10 MG tablet Take 1 tablet (10 mg total) by mouth daily. (Patient not taking: Reported on 10/31/2022)   docusate sodium (COLACE) 100 MG capsule Take 100 mg by mouth 2 (two) times daily. (Patient not taking: Reported on 07/04/2022)   gabapentin (NEURONTIN) 300 MG  capsule TAKE 1 CAPSULE BY MOUTH AT BEDTIME AS NEEDED (Patient not taking: Reported on 07/04/2022)   nitrofurantoin, macrocrystal-monohydrate, (MACROBID) 100 MG capsule Take 1 capsule (100 mg total) by mouth every 12 (twelve) hours.   Testosterone (ANDROGEL) 20.25 MG/1.25GM (1.62%) GEL Apply 2 packets topically daily. (Patient not taking: Reported on 10/31/2022)   No facility-administered encounter medications on file as of 10/31/2022.    Allergies (verified) Patient has no known allergies.   History: Past Medical History:  Diagnosis Date   Anxiety    History of chickenpox    Hypertension    Past Surgical History:  Procedure Laterality Date   FINGER SURGERY     Family History  Problem Relation Age of Onset   Cancer Mother    Anxiety disorder Mother    Diabetes Mother    Alcohol abuse Father    Heart disease Father    Heart attack Father 36   Cancer Sister    Diabetes Brother    Cancer Sister    Cancer Sister    Social History   Socioeconomic History   Marital status: Married    Spouse name: Not on file   Number of children: Not on file   Years of education: Not on file   Highest education level: Not on file  Occupational History   Not on file  Tobacco Use   Smoking status: Never   Smokeless tobacco: Never  Vaping Use   Vaping status: Never Used  Substance and Sexual Activity   Alcohol use: Not  Currently   Drug use: Not Currently   Sexual activity: Yes  Other Topics Concern   Not on file  Social History Narrative   Not on file   Social Determinants of Health   Financial Resource Strain: Low Risk  (10/31/2022)   Overall Financial Resource Strain (CARDIA)    Difficulty of Paying Living Expenses: Not hard at all  Food Insecurity: No Food Insecurity (10/31/2022)   Hunger Vital Sign    Worried About Running Out of Food in the Last Year: Never true    Ran Out of Food in the Last Year: Never true  Transportation Needs: No Transportation Needs (10/31/2022)    PRAPARE - Administrator, Civil Service (Medical): No    Lack of Transportation (Non-Medical): No  Physical Activity: Sufficiently Active (10/31/2022)   Exercise Vital Sign    Days of Exercise per Week: 6 days    Minutes of Exercise per Session: 40 min  Stress: No Stress Concern Present (10/31/2022)   [employer] of Occupational Health - Occupational Stress Questionnaire    Feeling of Stress : Not at all  Social Connections: Moderately Isolated (10/31/2022)   Social Connection and Isolation Panel [NHANES]    Frequency of Communication with Friends and Family: More than three times a week    Frequency of Social Gatherings with Friends and Family: Once a week    Attends Religious Services: Never    Database administrator or Organizations: No    Attends Engineer, structural: Never    Marital Status: Married    Tobacco Counseling Counseling given: Not Answered   Clinical Intake:  Pre-visit preparation completed: Yes  Pain : No/denies pain     Diabetes: No  How often do you need to have someone help you when you read instructions, pamphlets, or other written materials from your doctor or pharmacy?: 1 - Never  Interpreter Needed?: No  Information entered by :: Remi Haggard LPN   Activities of Daily Living    10/31/2022    9:08 AM 01/18/2022   10:29 AM  In your present state of health, do you have any difficulty performing the following activities:  Hearing? 0 0  Vision? 0 0  Difficulty concentrating or making decisions? 0 0  Walking or climbing stairs? 0 0  Dressing or bathing? 0 0  Doing errands, shopping? 0 0  Preparing Food and eating ? N N  Using the Toilet? N N  In the past six months, have you accidently leaked urine? N N  Do you have problems with loss of bowel control? N N  Managing your Medications? N N  Managing your Finances? N N  Housekeeping or managing your Housekeeping? N N    Patient Care Team: Shade Flood, MD as PCP -  General (Family Medicine) Erroll Luna, [facility] (Inactive) (Pharmacist)  Indicate any recent Medical Services you may have received from other than [facility] providers in the past year (date may be approximate).     Assessment:   This is a routine wellness examination for David Dalton.  Hearing/Vision screen Hearing Screening - Comments:: No trouble hearing Vision Screening - Comments:: Not up to date [employer]  Dietary issues and exercise activities discussed:     Goals Addressed             This Visit's Progress    Weight (lb) < 200 lb (90.7 kg)         Depression Screen  10/31/2022    9:11 AM 01/18/2022   10:28 AM 11/06/2021    8:21 AM 07/06/2021   10:22 AM 04/20/2021   10:38 AM 12/29/2020    8:42 AM 11/17/2020   10:31 AM  PHQ 2/9 Scores  PHQ - 2 Score 0 0 0 0 0 0 0  PHQ- 9 Score 1   0  0 1    Fall Risk    10/31/2022    9:03 AM 01/18/2022   10:26 AM 11/06/2021    8:21 AM 07/06/2021   10:23 AM 04/20/2021   10:37 AM  Fall Risk   Falls in the past year? 0 0 0 0 0  Number falls in past yr: 0 0 0  0  Injury with Fall? 0 0 0  0  Risk for fall due to :  No Fall Risks No Fall Risks No Fall Risks No Fall Risks  Follow up Falls evaluation completed;Education provided;Falls prevention discussed Falls prevention discussed Falls evaluation completed Falls evaluation completed Falls evaluation completed    MEDICARE RISK AT HOME:  Medicare Risk at Home - 10/31/22 0907     Any stairs in or around the home? No    If so, are there any without handrails? No    Home free of loose throw rugs in walkways, pet beds, electrical cords, etc? Yes    Adequate lighting in your home to reduce risk of falls? Yes    Life alert? No    Use of a cane, walker or w/c? No    Grab bars in the bathroom? No    Shower chair or bench in shower? No    Elevated toilet seat or a handicapped toilet? No             TIMED UP AND GO:  Was the test performed?  No    Cognitive Function:         10/31/2022    9:08 AM 01/18/2022   10:29 AM  6CIT Screen  What Year? 0 points 0 points  What month? 0 points 0 points  What time? 0 points 0 points  Count back from 20 0 points 0 points  Months in reverse 0 points 0 points  Repeat phrase 0 points 0 points  Total Score 0 points 0 points    Immunizations Immunization History  Administered Date(s) Administered   Tdap 01/02/2019    TDAP status: Up to date  Flu Vaccine status: Declined, Education has been provided regarding the importance of this vaccine but patient still declined. Advised may receive this vaccine at local pharmacy or Health Dept. Aware to provide a copy of the vaccination record if obtained from local pharmacy or Health Dept. Verbalized acceptance and understanding.  Pneumococcal vaccine status: Declined,  Education has been provided regarding the importance of this vaccine but patient still declined. Advised may receive this vaccine at local pharmacy or Health Dept. Aware to provide a copy of the vaccination record if obtained from local pharmacy or Health Dept. Verbalized acceptance and understanding.   Covid-19 vaccine status: Declined, Education has been provided regarding the importance of this vaccine but patient still declined. Advised may receive this vaccine at local pharmacy or Health Dept.or vaccine clinic. Aware to provide a copy of the vaccination record if obtained from local pharmacy or Health Dept. Verbalized acceptance and understanding.  Qualifies for Shingles Vaccine? Yes   Zostavax completed No   Shingrix Completed?: No.    Education has been provided regarding the importance of  this vaccine. Patient has been advised to call insurance company to determine out of pocket expense if they have not yet received this vaccine. Advised may also receive vaccine at local pharmacy or Health Dept. Verbalized acceptance and understanding.  Screening Tests Health Maintenance  Topic Date Due   Pneumonia Vaccine  23+ Years old (1 of 1 - PCV) 06/14/2023 (Originally 04/17/2019)   INFLUENZA VACCINE  11/15/2022   HEMOGLOBIN A1C  12/13/2022   Diabetic kidney evaluation - eGFR measurement  06/14/2023   Diabetic kidney evaluation - Urine ACR  06/14/2023   FOOT EXAM  06/14/2023   OPHTHALMOLOGY EXAM  08/29/2023   Medicare Annual Wellness (AWV)  10/31/2023   DTaP/Tdap/Td (2 - Td or Tdap) 01/01/2029   Colonoscopy  06/30/2030   Hepatitis C Screening  Completed   HPV VACCINES  Aged Out   COVID-19 Vaccine  Discontinued   Zoster Vaccines- Shingrix  Discontinued    Health Maintenance  There are no preventive care reminders to display for this patient.  Colorectal cancer screening: Type of screening: Colonoscopy. Completed 2009. Repeat every 10 years  Lung Cancer Screening: (Low Dose CT Chest recommended if Age 55-80 years, 20 pack-year currently smoking OR have quit w/in 15years.) does not qualify.   Lung Cancer Screening Referral:   Additional Screening:  Hepatitis C Screening: does not qualify; Completed 2024  Vision Screening: Recommended annual ophthalmology exams for early detection of glaucoma and other disorders of the eye. Is the patient up to date with their annual eye exam?  Yes  Who is the provider or what is the name of the office in which the patient attends annual eye exams? [facility]  If pt is not established with a provider, would they like to be referred to a provider to establish care? No .   Dental Screening: Recommended annual dental exams for proper oral hygiene   Community Resource Referral / Chronic Care Management: CRR required this visit?  No   CCM required this visit?  No     Plan:     I have personally reviewed and noted the following in the patient's chart:   Medical and social history Use of alcohol, tobacco or illicit drugs  Current medications and supplements including opioid prescriptions. Patient is not currently taking opioid prescriptions. Functional  ability and status Nutritional status Physical activity Advanced directives List of other physicians Hospitalizations, surgeries, and ER visits in previous 12 months Vitals Screenings to include cognitive, depression, and falls Referrals and appointments  In addition, I have reviewed and discussed with patient certain preventive protocols, quality metrics, and best practice recommendations. A written personalized care plan for preventive services as well as general preventive health recommendations were provided to patient.     Remi Haggard, LPN   1/61/0960   After Visit Summary: (Declined) Due to this being a telephonic visit, with patients personalized plan was offered to patient but patient Declined AVS at this time   Nurse Notes:

## 2023-01-09 DIAGNOSIS — L820 Inflamed seborrheic keratosis: Secondary | ICD-10-CM | POA: Diagnosis not present

## 2023-01-19 DIAGNOSIS — N189 Chronic kidney disease, unspecified: Secondary | ICD-10-CM | POA: Diagnosis not present

## 2023-01-19 DIAGNOSIS — Z87891 Personal history of nicotine dependence: Secondary | ICD-10-CM | POA: Diagnosis not present

## 2023-01-19 DIAGNOSIS — R32 Unspecified urinary incontinence: Secondary | ICD-10-CM | POA: Diagnosis not present

## 2023-01-19 DIAGNOSIS — M1990 Unspecified osteoarthritis, unspecified site: Secondary | ICD-10-CM | POA: Diagnosis not present

## 2023-01-19 DIAGNOSIS — Z8249 Family history of ischemic heart disease and other diseases of the circulatory system: Secondary | ICD-10-CM | POA: Diagnosis not present

## 2023-01-19 DIAGNOSIS — Z809 Family history of malignant neoplasm, unspecified: Secondary | ICD-10-CM | POA: Diagnosis not present

## 2023-01-19 DIAGNOSIS — Z9181 History of falling: Secondary | ICD-10-CM | POA: Diagnosis not present

## 2023-01-19 DIAGNOSIS — E785 Hyperlipidemia, unspecified: Secondary | ICD-10-CM | POA: Diagnosis not present

## 2023-01-19 DIAGNOSIS — I129 Hypertensive chronic kidney disease with stage 1 through stage 4 chronic kidney disease, or unspecified chronic kidney disease: Secondary | ICD-10-CM | POA: Diagnosis not present

## 2023-01-19 DIAGNOSIS — K219 Gastro-esophageal reflux disease without esophagitis: Secondary | ICD-10-CM | POA: Diagnosis not present

## 2023-01-19 DIAGNOSIS — M5430 Sciatica, unspecified side: Secondary | ICD-10-CM | POA: Diagnosis not present

## 2023-01-30 ENCOUNTER — Other Ambulatory Visit: Payer: Self-pay | Admitting: Family Medicine

## 2023-01-30 DIAGNOSIS — I10 Essential (primary) hypertension: Secondary | ICD-10-CM

## 2023-04-05 ENCOUNTER — Telehealth: Payer: Self-pay | Admitting: Family Medicine

## 2023-04-05 NOTE — Telephone Encounter (Addendum)
Caller name: [facility]  On DPR?: NA   Call back number:   Provider they see: Shade Flood, MD  Reason for call: Recall foreign body found in medication 10mg  Lisinopril  No charge sheet   Placed in front bin   [facility]

## 2023-04-29 ENCOUNTER — Other Ambulatory Visit: Payer: Self-pay | Admitting: Family Medicine

## 2023-04-29 DIAGNOSIS — I10 Essential (primary) hypertension: Secondary | ICD-10-CM

## 2023-05-23 DIAGNOSIS — M79672 Pain in left foot: Secondary | ICD-10-CM | POA: Diagnosis not present

## 2023-05-23 DIAGNOSIS — M7989 Other specified soft tissue disorders: Secondary | ICD-10-CM | POA: Diagnosis not present

## 2023-06-21 DIAGNOSIS — M109 Gout, unspecified: Secondary | ICD-10-CM | POA: Diagnosis not present

## 2023-07-28 ENCOUNTER — Other Ambulatory Visit: Payer: Self-pay | Admitting: Family Medicine

## 2023-07-28 DIAGNOSIS — I10 Essential (primary) hypertension: Secondary | ICD-10-CM

## 2023-10-31 ENCOUNTER — Other Ambulatory Visit: Payer: Self-pay | Admitting: Family Medicine

## 2023-10-31 DIAGNOSIS — I10 Essential (primary) hypertension: Secondary | ICD-10-CM

## 2023-11-05 ENCOUNTER — Ambulatory Visit (INDEPENDENT_AMBULATORY_CARE_PROVIDER_SITE_OTHER)

## 2023-11-05 VITALS — Ht 68.0 in | Wt 210.0 lb

## 2023-11-05 DIAGNOSIS — Z0000 Encounter for general adult medical examination without abnormal findings: Secondary | ICD-10-CM

## 2023-11-05 NOTE — Progress Notes (Signed)
 Subjective:   David Dalton is a 68 y.o. male who presents for Medicare Annual/Subsequent preventive examination.  Visit Complete: Virtual I connected with  David Dalton on 11/05/23 by a audio enabled telemedicine application and verified that I am speaking with the correct person using two identifiers.  Patient Location: Home  Provider Location: Home Office  I discussed the limitations of evaluation and management by telemedicine. The patient expressed understanding and agreed to proceed.  Vital Signs: Because this visit was a virtual/telehealth visit, some criteria may be missing or patient reported. Any vitals not documented were not able to be obtained and vitals that have been documented are patient reported.  Cardiac Risk Factors include: hypertension;male gender;advanced age (>73men, >21 women);obesity (BMI >30kg/m2);family history of premature cardiovascular disease     Objective:    Today's Vitals   11/05/23 0936  Weight: 210 lb (95.3 kg)  Height: 5' 8 (1.727 m)   Body mass index is 31.93 kg/m.     11/05/2023    9:34 AM 10/31/2022    9:04 AM 01/18/2022   10:29 AM 12/29/2020    8:43 AM  Advanced Directives  Does Patient Have a Medical Advance Directive? No Yes Yes No  Type of Advance Directive  Living will Healthcare Power of [place];Living will   Copy of Healthcare Power of Attorney in Chart?   No - copy requested   Would patient like information on creating a medical advance directive? No - Patient declined   No - Patient declined    Current Medications (verified) Outpatient Encounter Medications as of 11/05/2023  Medication Sig   Cholecalciferol (VITAMIN D3) 250 MCG (10000 UT) TABS Take 1 tablet by mouth daily.   clotrimazole  (LOTRIMIN ) 1 % cream Apply 1 application topically 2 (two) times daily. As needed for genital rash.   clotrimazole -betamethasone  (LOTRISONE ) cream Apply 1 Application topically 2 (two) times daily.   lisinopril  (ZESTRIL ) 10 MG tablet  Take 1 tablet by mouth once daily   atorvastatin  (LIPITOR) 10 MG tablet Take 1 tablet (10 mg total) by mouth daily. (Patient not taking: Reported on 11/05/2023)   docusate sodium (COLACE) 100 MG capsule Take 100 mg by mouth 2 (two) times daily. (Patient not taking: Reported on 11/05/2023)   gabapentin  (NEURONTIN ) 300 MG capsule TAKE 1 CAPSULE BY MOUTH AT BEDTIME AS NEEDED (Patient not taking: Reported on 11/05/2023)   nitrofurantoin , macrocrystal-monohydrate, (MACROBID ) 100 MG capsule Take 1 capsule (100 mg total) by mouth every 12 (twelve) hours.   Testosterone  (ANDROGEL ) 20.25 MG/1.25GM (1.62%) GEL Apply 2 packets topically daily. (Patient not taking: Reported on 11/05/2023)   No facility-administered encounter medications on file as of 11/05/2023.    Allergies (verified) Patient has no known allergies.   History: Past Medical History:  Diagnosis Date   Anxiety    History of chickenpox    Hypertension    Past Surgical History:  Procedure Laterality Date   FINGER SURGERY     Family History  Problem Relation Age of Onset   Cancer Mother    Anxiety disorder Mother    Diabetes Mother    Alcohol abuse Father    Heart disease Father    Heart attack Father 69   Cancer Sister    Diabetes Brother    Cancer Sister    Cancer Sister    Social History   Socioeconomic History   Marital status: Married    Spouse name: Not on file   Number of children: Not on file  Years of education: Not on file   Highest education level: Not on file  Occupational History   Not on file  Tobacco Use   Smoking status: Never   Smokeless tobacco: Never  Vaping Use   Vaping status: Never Used  Substance and Sexual Activity   Alcohol use: Not Currently   Drug use: Not Currently   Sexual activity: Yes  Other Topics Concern   Not on file  Social History Narrative   Not on file   Social Drivers of Health   Financial Resource Strain: Low Risk  (11/05/2023)   Overall Financial Resource Strain  (CARDIA)    Difficulty of Paying Living Expenses: Not hard at all  Food Insecurity: No Food Insecurity (11/05/2023)   Hunger Vital Sign    Worried About Running Out of Food in the Last Year: Never true    Ran Out of Food in the Last Year: Never true  Transportation Needs: No Transportation Needs (11/05/2023)   PRAPARE - Administrator, Civil Service (Medical): No    Lack of Transportation (Non-Medical): No  Physical Activity: Unknown (11/05/2023)   Exercise Vital Sign    Days of Exercise per Week: 2 days    Minutes of Exercise per Session: Not on file  Stress: No Stress Concern Present (11/05/2023)   David Dalton [employer] Occupational [employer] - Occupational Stress Questionnaire    Feeling of Stress: Not at all  Social Connections: Moderately Isolated (11/05/2023)   Social Connection and Isolation Panel    Frequency of Communication with Friends and Family: More than three times a week    Frequency of Social Gatherings with Friends and Family: More than three times a week    Attends Religious Services: Never    Database administrator or Organizations: No    Attends Engineer, structural: Never    Marital Status: Married    Tobacco Counseling Counseling given: Not Answered   Clinical Intake:  Pre-visit preparation completed: Yes  Pain : No/denies pain     Diabetes: No  How often do you need to have someone help you when you read instructions, pamphlets, or other written materials from your doctor or pharmacy?: 1 - Never  Interpreter Needed?: No  Information entered by :: Mliss Graff LPN   Activities of Daily Living    11/05/2023    9:36 AM  In your present state of health, do you have any difficulty performing the following activities:  Hearing? 0  Vision? 0  Difficulty concentrating or making decisions? 0  Walking or climbing stairs? 0  Dressing or bathing? 0  Doing errands, shopping? 0  Preparing Food and eating ? N  Using the Toilet? N  In the  past six months, have you accidently leaked urine? N  Do you have problems with loss of bowel control? N  Managing your Medications? N  Managing your Finances? N  Housekeeping or managing your Housekeeping? N    Patient Care Team: Levora Reyes SAUNDERS, MD as PCP - General (Family Medicine) Nicholaus Sherlean CROME, [facility] (Inactive) (Pharmacist)  Indicate any recent Medical Services you may have received from other than [facility] providers in the past year (date may be approximate).     Assessment:   This is a routine wellness examination for Meyer.  Hearing/Vision screen Hearing Screening - Comments:: No trouble heaing Vision Screening - Comments:: Up to date [employer]   Goals Addressed  This Visit's Progress    Patient Stated       Stay healthy More leisure time       Depression Screen    11/05/2023    9:39 AM 10/31/2022    9:11 AM 01/18/2022   10:28 AM 11/06/2021    8:21 AM 07/06/2021   10:22 AM 04/20/2021   10:38 AM 12/29/2020    8:42 AM  PHQ 2/9 Scores  PHQ - 2 Score 0 0 0 0 0 0 0  PHQ- 9 Score 0 1   0  0    Fall Risk    11/05/2023    9:33 AM 10/31/2022    9:03 AM 01/18/2022   10:26 AM 11/06/2021    8:21 AM 07/06/2021   10:23 AM  Fall Risk   Falls in the past year? 0 0 0 0 0  Number falls in past yr: 0 0 0 0   Injury with Fall? 0 0 0 0   Risk for fall due to :   No Fall Risks No Fall Risks No Fall Risks  Follow up Falls evaluation completed;Education provided;Falls prevention discussed Falls evaluation completed;Education provided;Falls prevention discussed Falls prevention discussed  Falls evaluation completed  Falls evaluation completed      Data saved with a previous flowsheet row definition    MEDICARE RISK AT HOME: Medicare Risk at Home Any stairs in or around the home?: No If so, are there any without handrails?: No Home free of loose throw rugs in walkways, pet beds, electrical cords, etc?: Yes Adequate lighting in your home to reduce risk of  falls?: Yes Life alert?: No Use of a cane, walker or w/c?: No Grab bars in the bathroom?: No Shower chair or bench in shower?: No Elevated toilet seat or a handicapped toilet?: No  TIMED UP AND GO:  Was the test performed?  No    Cognitive Function:        11/05/2023    9:37 AM 10/31/2022    9:08 AM 01/18/2022   10:29 AM  6CIT Screen  What Year? 0 points 0 points 0 points  What month? 0 points 0 points 0 points  What time? 0 points 0 points 0 points  Count back from 20 0 points 0 points 0 points  Months in reverse 0 points 0 points 0 points  Repeat phrase 0 points 0 points 0 points  Total Score 0 points 0 points 0 points    Immunizations Immunization History  Administered Date(s) Administered   Tdap 01/02/2019    TDAP status: Up to date  Flu Vaccine status: Declined, Education has been provided regarding the importance of this vaccine but patient still declined. Advised may receive this vaccine at local pharmacy or Health Dept. Aware to provide a copy of the vaccination record if obtained from local pharmacy or Health Dept. Verbalized acceptance and understanding.  Pneumococcal vaccine status: Declined,  Education has been provided regarding the importance of this vaccine but patient still declined. Advised may receive this vaccine at local pharmacy or Health Dept. Aware to provide a copy of the vaccination record if obtained from local pharmacy or Health Dept. Verbalized acceptance and understanding.   Covid-19 vaccine status: Declined, Education has been provided regarding the importance of this vaccine but patient still declined. Advised may receive this vaccine at local pharmacy or Health Dept.or vaccine clinic. Aware to provide a copy of the vaccination record if obtained from local pharmacy or Health Dept. Verbalized acceptance and understanding.  Qualifies  for Shingles Vaccine? Yes   Zostavax completed No   Shingrix Completed?: No.    Education has been provided  regarding the importance of this vaccine. Patient has been advised to call insurance company to determine out of pocket expense if they have not yet received this vaccine. Advised may also receive vaccine at local pharmacy or Health Dept. Verbalized acceptance and understanding.  Screening Tests Health Maintenance  Topic Date Due   Diabetic kidney evaluation - Urine ACR  Never done   HEMOGLOBIN A1C  12/13/2022   Diabetic kidney evaluation - eGFR measurement  06/14/2023   FOOT EXAM  06/14/2023   OPHTHALMOLOGY EXAM  08/29/2023   INFLUENZA VACCINE  11/15/2023   Medicare Annual Wellness (AWV)  11/04/2024   DTaP/Tdap/Td (2 - Td or Tdap) 01/01/2029   Colonoscopy  06/30/2030   Hepatitis C Screening  Completed   Hepatitis B Vaccines  Aged Out   HPV VACCINES  Aged Out   Meningococcal B Vaccine  Aged Out   Pneumococcal Vaccine: 50+ Years  Discontinued   COVID-19 Vaccine  Discontinued   Zoster Vaccines- Shingrix  Discontinued    Health Maintenance  Health Maintenance Due  Topic Date Due   Diabetic kidney evaluation - Urine ACR  Never done   HEMOGLOBIN A1C  12/13/2022   Diabetic kidney evaluation - eGFR measurement  06/14/2023   FOOT EXAM  06/14/2023   OPHTHALMOLOGY EXAM  08/29/2023    Colorectal cancer screening: Type of screening: Colonoscopy. Completed  . Repeat every   years  Lung Cancer Screening: (Low Dose CT Chest recommended if Age 46-80 years, 20 pack-year currently smoking OR have quit w/in 15years.) does not qualify.   Lung Cancer Screening Referral:   Additional Screening:  Hepatitis C Screening: does not qualify; Completed 2024  Vision Screening: Recommended annual ophthalmology exams for early detection of glaucoma and other disorders of the eye. Is the patient up to date with their annual eye exam?  Yes  Who is the provider or what is the name of the office in which the patient attends annual eye exams? [facility] If pt is not established with a provider, would they like to  be referred to a provider to establish care? No .   Dental Screening: Recommended annual dental exams for proper oral hygiene  D  Community Resource Referral / Chronic Care Management: CRR required this visit?  No   CCM required this visit?  No     Plan:     I have personally reviewed and noted the following in the patient's chart:   Medical and social history Use of alcohol, tobacco or illicit drugs  Current medications and supplements including opioid prescriptions. Patient is not currently taking opioid prescriptions. Functional ability and status Nutritional status Physical activity Advanced directives List of other physicians Hospitalizations, surgeries, and ER visits in previous 12 months Vitals Screenings to include cognitive, depression, and falls Referrals and appointments  In addition, I have reviewed and discussed with patient certain preventive protocols, quality metrics, and best practice recommendations. A written personalized care plan for preventive services as well as general preventive health recommendations were provided to patient.     Mliss Graff, LPN   [id number]   After Visit Summary: (MyChart) Due to this being a telephonic visit, the after visit summary with patients personalized plan was offered to patient via MyChart   Nurse Notes:

## 2023-11-05 NOTE — Patient Instructions (Signed)
 Mr. David Dalton , Thank you for taking time to come for your Medicare Wellness Visit. I appreciate your ongoing commitment to your health goals. Please review the following plan we discussed and let me know if I can assist you in the future.   Screening recommendations/referrals: Colonoscopy:  Recommended yearly ophthalmology/optometry visit for glaucoma screening and checkup Recommended yearly dental visit for hygiene and checkup  Vaccinations: Influenza vaccine:  Pneumococcal vaccine:  Tdap vaccine:  Shingles vaccine:       Preventive Care 65 Years and Older, Male Preventive care refers to lifestyle choices and visits with your health care provider that can promote health and wellness. What does preventive care include? A yearly physical exam. This is also called an annual well check. Dental exams once or twice a year. Routine eye exams. Ask your health care provider how often you should have your eyes checked. Personal lifestyle choices, including: Daily care of your teeth and gums. Regular physical activity. Eating a healthy diet. Avoiding tobacco and drug use. Limiting alcohol use. Practicing safe sex. Taking low doses of aspirin every day. Taking vitamin and mineral supplements as recommended by your health care provider. What happens during an annual well check? The services and screenings done by your health care provider during your annual well check will depend on your age, overall health, lifestyle risk factors, and family history of disease. Counseling  Your health care provider may ask you questions about your: Alcohol use. Tobacco use. Drug use. Emotional well-being. Home and relationship well-being. Sexual activity. Eating habits. History of falls. Memory and ability to understand (cognition). Work and work Astronomer. Screening  You may have the following tests or measurements: Height, weight, and BMI. Blood pressure. Lipid and cholesterol levels. These may  be checked every 5 years, or more frequently if you are over 54 years old. Skin check. Lung cancer screening. You may have this screening every year starting at age 98 if you have a 30-pack-year history of smoking and currently smoke or have quit within the past 15 years. Fecal occult blood test (FOBT) of the stool. You may have this test every year starting at age 49. Flexible sigmoidoscopy or colonoscopy. You may have a sigmoidoscopy every 5 years or a colonoscopy every 10 years starting at age 55. Prostate cancer screening. Recommendations will vary depending on your family history and other risks. Hepatitis C blood test. Hepatitis B blood test. Sexually transmitted disease (STD) testing. Diabetes screening. This is done by checking your blood sugar (glucose) after you have not eaten for a while (fasting). You may have this done every 1-3 years. Abdominal aortic aneurysm (AAA) screening. You may need this if you are a current or former smoker. Osteoporosis. You may be screened starting at age 73 if you are at high risk. Talk with your health care provider about your test results, treatment options, and if necessary, the need for more tests. Vaccines  Your health care provider may recommend certain vaccines, such as: Influenza vaccine. This is recommended every year. Tetanus, diphtheria, and acellular pertussis (Tdap, Td) vaccine. You may need a Td booster every 10 years. Zoster vaccine. You may need this after age 25. Pneumococcal 13-valent conjugate (PCV13) vaccine. One dose is recommended after age 92. Pneumococcal polysaccharide (PPSV23) vaccine. One dose is recommended after age 97. Talk to your health care provider about which screenings and vaccines you need and how often you need them. This information is not intended to replace advice given to you by your health care  provider. Make sure you discuss any questions you have with your health care provider. Document Released: 04/29/2015  Document Revised: 12/21/2015 Document Reviewed: 02/01/2015 Elsevier Interactive Patient Education  2017 [employer].  Fall Prevention in the Home Falls can cause injuries. They can happen to people of all ages. There are many things you can do to make your home safe and to help prevent falls. What can I do on the outside of my home? Regularly fix the edges of walkways and driveways and fix any cracks. Remove anything that might make you trip as you walk through a door, such as a raised step or threshold. Trim any bushes or trees on the path to your home. Use bright outdoor lighting. Clear any walking paths of anything that might make someone trip, such as rocks or tools. Regularly check to see if handrails are loose or broken. Make sure that both sides of any steps have handrails. Any raised decks and porches should have guardrails on the edges. Have any leaves, snow, or ice cleared regularly. Use sand or salt on walking paths during winter. Clean up any spills in your garage right away. This includes oil or grease spills. What can I do in the bathroom? Use night lights. Install grab bars by the toilet and in the tub and shower. Do not use towel bars as grab bars. Use non-skid mats or decals in the tub or shower. If you need to sit down in the shower, use a plastic, non-slip stool. Keep the floor dry. Clean up any water that spills on the floor as soon as it happens. Remove soap buildup in the tub or shower regularly. Attach bath mats securely with double-sided non-slip rug tape. Do not have throw rugs and other things on the floor that can make you trip. What can I do in the bedroom? Use night lights. Make sure that you have a light by your bed that is easy to reach. Do not use any sheets or blankets that are too big for your bed. They should not hang down onto the floor. Have a firm chair that has side arms. You can use this for support while you get dressed. Do not have throw rugs  and other things on the floor that can make you trip. What can I do in the kitchen? Clean up any spills right away. Avoid walking on wet floors. Keep items that you use a lot in easy-to-reach places. If you need to reach something above you, use a strong step stool that has a grab bar. Keep electrical cords out of the way. Do not use floor polish or wax that makes floors slippery. If you must use wax, use non-skid floor wax. Do not have throw rugs and other things on the floor that can make you trip. What can I do with my stairs? Do not leave any items on the stairs. Make sure that there are handrails on both sides of the stairs and use them. Fix handrails that are broken or loose. Make sure that handrails are as long as the stairways. Check any carpeting to make sure that it is firmly attached to the stairs. Fix any carpet that is loose or worn. Avoid having throw rugs at the top or bottom of the stairs. If you do have throw rugs, attach them to the floor with carpet tape. Make sure that you have a light switch at the top of the stairs and the bottom of the stairs. If you do not have  them, ask someone to add them for you. What else can I do to help prevent falls? Wear shoes that: Do not have high heels. Have rubber bottoms. Are comfortable and fit you well. Are closed at the toe. Do not wear sandals. If you use a stepladder: Make sure that it is fully opened. Do not climb a closed stepladder. Make sure that both sides of the stepladder are locked into place. Ask someone to hold it for you, if possible. Clearly mark and make sure that you can see: Any grab bars or handrails. First and last steps. Where the edge of each step is. Use tools that help you move around (mobility aids) if they are needed. These include: Canes. Walkers. Scooters. Crutches. Turn on the lights when you go into a dark area. Replace any light bulbs as soon as they burn out. Set up your furniture so you have a  clear path. Avoid moving your furniture around. If any of your floors are uneven, fix them. If there are any pets around you, be aware of where they are. Review your medicines with your doctor. Some medicines can make you feel dizzy. This can increase your chance of falling. Ask your doctor what other things that you can do to help prevent falls. This information is not intended to replace advice given to you by your health care provider. Make sure you discuss any questions you have with your health care provider. Document Released: 01/27/2009 Document Revised: 09/08/2015 Document Reviewed: 05/07/2014 Elsevier Interactive Patient Education  2017 [employer].

## 2024-01-28 ENCOUNTER — Other Ambulatory Visit: Payer: Self-pay | Admitting: Family Medicine

## 2024-01-28 DIAGNOSIS — I10 Essential (primary) hypertension: Secondary | ICD-10-CM

## 2024-01-28 NOTE — Telephone Encounter (Signed)
 Called patient and left vm to return call to schedule follow up appt

## 2024-01-28 NOTE — Telephone Encounter (Signed)
 Pt need appt

## 2024-01-30 NOTE — Telephone Encounter (Signed)
 Called to make an appointment, no answer, LM to call back and make an appointment not seen in over 1 year

## 2024-02-04 ENCOUNTER — Other Ambulatory Visit: Payer: Self-pay | Admitting: Family Medicine

## 2024-02-04 DIAGNOSIS — I10 Essential (primary) hypertension: Secondary | ICD-10-CM

## 2024-02-06 ENCOUNTER — Ambulatory Visit (INDEPENDENT_AMBULATORY_CARE_PROVIDER_SITE_OTHER): Admitting: Family Medicine

## 2024-02-06 ENCOUNTER — Encounter: Payer: Self-pay | Admitting: Family Medicine

## 2024-02-06 VITALS — BP 156/96 | HR 89 | Temp 97.9°F | Wt 216.4 lb

## 2024-02-06 DIAGNOSIS — E785 Hyperlipidemia, unspecified: Secondary | ICD-10-CM

## 2024-02-06 DIAGNOSIS — I10 Essential (primary) hypertension: Secondary | ICD-10-CM

## 2024-02-06 DIAGNOSIS — E1169 Type 2 diabetes mellitus with other specified complication: Secondary | ICD-10-CM | POA: Diagnosis not present

## 2024-02-06 DIAGNOSIS — E1165 Type 2 diabetes mellitus with hyperglycemia: Secondary | ICD-10-CM | POA: Diagnosis not present

## 2024-02-06 MED ORDER — LISINOPRIL 20 MG PO TABS
20.0000 mg | ORAL_TABLET | Freq: Every day | ORAL | 1 refills | Status: AC
Start: 2024-02-06 — End: ?

## 2024-02-06 NOTE — Patient Instructions (Addendum)
 I will check your labs again today, but as we discussed I am concerned about the untreated high cholesterol and diabetes and risks of stroke, heart disease, kidney disease, vascular disease and other complications. Please let me know if you change your mind about treatment for these conditions.  Make sure your eye doctor sends me a report for a diabetes retinopathy screening.  Blood pressure unfortunately is running too high.  Increase lisinopril  to 20 mg/day.  I sent a new prescription to your pharmacy but you can take 2 of the 10 mg that you have at home until you fill that prescription.  Monitor your home blood pressures and if you have any persistent readings above 130/80 at the higher dose, follow-up with me to discuss other changes.  Otherwise I will see you in 3 months.  Take care.     Type 2 Diabetes Mellitus, Self-Care, Adult Caring for yourself after you have been diagnosed with type 2 diabetes (type 2 diabetes mellitus) means keeping your blood sugar (glucose) under control with a balance of: Nutrition. Exercise. Lifestyle changes. Medicines or insulin, if needed. Support from your team of health care providers and others. What are the risks? Having type 2 diabetes can put you at risk for other long-term (chronic) conditions, such as heart disease and kidney disease. Your health care provider may prescribe medicines to help prevent complications from diabetes. How to monitor your blood glucose  Check your blood glucose every day or as often as told by your health care provider. Have your A1C (hemoglobin A1C) level checked two or more times a year, or as often as told by your health care provider. Your health care provider will set personalized treatment goals for you. Generally, the goal of treatment is to maintain the following blood glucose levels: Before meals: 80-130 mg/dL (4.4-7.2 mmol/L). After meals: below 180 mg/dL (10 mmol/L). A1C level: less than 7%. How to manage  hyperglycemia and hypoglycemia Hyperglycemia symptoms Hyperglycemia, also called high blood glucose, occurs when blood glucose is too high. Make sure you know the early signs of hyperglycemia, such as: Increased thirst. Hunger. Feeling very tired. Needing to urinate more often than usual. Blurry vision. Hypoglycemia symptoms Hypoglycemia, also called low blood glucose, occurs with a blood glucose level at or below 70 mg/dL (3.9 mmol/L). Diabetes medicines lower your blood glucose and can cause hypoglycemia. The risk for hypoglycemia increases during or after exercise, during sleep, during illness, and when skipping meals or not eating for a long time (fasting). It is important to know the symptoms of hypoglycemia and treat it right away. Always have a 15-gram rapid-acting carbohydrate snack with you to treat low blood glucose. Family members and close friends should also know the symptoms and understand how to treat hypoglycemia, in case you are not able to treat yourself. Symptoms may include: Hunger. Anxiety. Sweating and feeling clammy. Dizziness or feeling light-headed. Sleepiness. Increased heart rate. Irritability. Tingling or numbness around the mouth, lips, or tongue. Restless sleep. Severe hypoglycemia is when your blood glucose level is at or below 54 mg/dL (3 mmol/L). Severe hypoglycemia is an emergency. Do not wait to see if the symptoms will go away. Get medical help right away. Call your local emergency services (911 in the [place] Do not drive yourself to the hospital. If you have severe hypoglycemia and you cannot eat or drink, you may need glucagon. A family member or close friend should learn how to check your blood glucose and how to give you glucagon.  Ask your health care provider if you need to have an emergency glucagon kit available. Follow these instructions at home: Medicines Take prescribed insulin or diabetes medicines as told by your health care provider. Do not  run out of insulin or other diabetes medicines. Plan ahead so you always have these available. If you use insulin, adjust your dosage based on your physical activity and what foods you eat. Your health care provider will tell you how to adjust your dosage. Take over-the-counter and prescription medicines only as told by your health care provider. Eating and drinking  What you eat and drink affects your blood glucose and your insulin dosage. Making good choices helps to control your diabetes and prevent other health problems. A healthy meal plan includes eating lean proteins, complex carbohydrates, fresh fruits and vegetables, low-fat dairy products, and healthy fats. Make an appointment to see a registered dietitian to help you create an eating plan that is right for you. Make sure that you: Follow instructions from your health care provider about eating or drinking restrictions. Drink enough fluid to keep your urine pale yellow. Keep a record of the carbohydrates that you eat. Do this by reading food labels and learning the standard serving sizes of foods. Follow your sick-day plan whenever you cannot eat or drink as usual. Make this plan in advance with your health care provider.  Activity Stay active. Exercise regularly, as told by your health care provider. This may include: Stretching and doing strength exercises, such as yoga or weight lifting, two or more times a week. Doing 150 minutes or more of moderate-intensity or vigorous-intensity exercise each week. This could be brisk walking, biking, or water aerobics. Spread out your activity over 3 or more days of the week. Do not go more than 2 days in a row without doing some kind of physical activity. When you start a new exercise or activity, work with your health care provider to adjust your insulin, medicines, or food intake as needed. Lifestyle Do not use any products that contain nicotine or tobacco. These products include cigarettes,  chewing tobacco, and vaping devices, such as e-cigarettes. If you need help quitting, ask your health care provider. If you drink alcohol and your health care provider says that it is safe for you: Limit how much you have to: 0-1 drink a day for women who are not pregnant. 0-2 drinks a day for men. Know how much alcohol is in your drink. In the [place] one drink equals one 12 oz bottle of beer (355 mL), one 5 oz glass of wine (148 mL), or one 1 oz glass of hard liquor (44 mL). Learn to manage stress. If you need help with this, ask your health care provider. Take care of your body  Keep your immunizations up to date. In addition to getting vaccinations as told by your health care provider, it is recommended that you get vaccinated against the following illnesses: The flu (influenza). Get a flu shot every year. Pneumonia. Hepatitis B. Schedule an eye exam soon after your diagnosis, and then one time every year after that. Check your skin and feet every day for cuts, bruises, redness, blisters, or sores. Schedule a foot exam with your health care provider once every year. Brush your teeth and gums two times a day, and floss one or more times a day. Visit your dentist one or more times every 6 months. Maintain a healthy weight. General instructions Share your diabetes management plan with people in your  workplace, school, and household. Carry a medical alert card or wear medical alert jewelry. Keep all follow-up visits. This is important. Questions to ask your health care provider Should I meet with a certified diabetes care and education specialist? Where can I find a support group for people with diabetes? Where to find more information For help and guidance and for more information about diabetes, please visit: American Diabetes Association (ADA): www.diabetes.org American Association of Diabetes Care and Education Specialists (ADCES): www.diabeteseducator.org International Diabetes  Federation (IDF): DCOnly.dk Summary Caring for yourself after you have been diagnosed with type 2 diabetes (type 2 diabetes mellitus) means keeping your blood sugar (glucose) under control with a balance of nutrition, exercise, lifestyle changes, and medicine. Check your blood glucose every day, as often as told by your health care provider. Having diabetes can put you at risk for other long-term (chronic) conditions, such as heart disease and kidney disease. Your health care provider may prescribe medicines to help prevent complications from diabetes. Share your diabetes management plan with people in your workplace, school, and household. Keep all follow-up visits. This is important. This information is not intended to replace advice given to you by your health care provider. Make sure you discuss any questions you have with your health care provider. Document Revised: 08/31/2020 Document Reviewed: 08/31/2020 [employer]vier Patient Education  2024 [employer].

## 2024-02-06 NOTE — Progress Notes (Signed)
 Subjective:  Patient ID: David Dalton, male    DOB: [birth year]  Age: 68 y.o. MRN: [id number]  CC:  Chief Complaint  Patient presents with   Medical Management of Chronic Issues    HPI David Dalton presents for follow-up of chronic conditions.  I have not seen him since February 2024.  Denies barriers to care. No specific reason.   Diabetes: With hyperglycemia, hyperlipidemia, peripheral neuropathy versus lumbar cause of neuropathy symptoms.  Treated with gabapentin  300 mg nightly for neuropathy, episodic dosing.  We have recommended medication including metformin previously with elevated A1c.  At his last visit he had been improving his eating habits, more exercise and had lost weight but unfortunately A1c was still elevated, slightly higher than previous readings at 8.1. Weigh had been about 210 in past year - few pound increase recently with less diet adherence.  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  He refuses any medication for diabetes.  No home readings.  Microalbumin: today Optho, foot exam, pneumovax:  Optho - plans to reschedule -tried to go this week and closed.  Declines foot exam.  Diabetic Foot Exam - Simple   No data filed     Lab Results  Component Value Date   HGBA1C 8.1 (H) 06/14/2022   HGBA1C 7.9 (H) 11/06/2021   HGBA1C 7.1 (A) 04/20/2021   Lab Results  Component Value Date   CREATININE 1.01 06/14/2022   Hypertension: Stable at 130/78 at his last visit, elevated the following March and again today.  Previously had been treated with lisinopril  10 mg daily. Still taking lisinopril  10mg  per day. No side effects.  Home readings: 130/80.  BP Readings from Last 3 Encounters:  02/06/24 (!) 156/96  07/04/22 (!) 156/80  06/14/22 130/78   Lab Results  Component Value Date   CREATININE 1.01 06/14/2022   Hyperlipidemia: Treated with Lipitor with intermittent dosing previously, once per  week.  Unfortunately very high LDL of 212 in February.  Risk of cardiovascular, cerebrovascular disease discussed especially with his underlying history of diabetes.  Initially we did discuss taking statin daily, with follow-up planned within a few weeks to review labs and changes.   Fasting today.  Refuses all cholesterol meds and does understand risks of CVD, CAD, and other health risks.   Lab Results  Component Value Date   CHOL 288 (H) 06/14/2022   HDL 40.00 06/14/2022   LDLDIRECT 212.0 06/14/2022   TRIG 212.0 (H) 06/14/2022   CHOLHDL 7 06/14/2022   Lab Results  Component Value Date   ALT 36 06/14/2022   AST 31 06/14/2022   ALKPHOS 88 06/14/2022   BILITOT 0.6 06/14/2022    HM: Declines flu vaccine.  Colonoscopy in 2021 - Dr. Rollin, Tubular adenoma, repeat in 7 yrs.   History Patient Active Problem List   Diagnosis Date Noted   Nonspecific abnormal electrocardiogram (ECG) (EKG) 07/10/2021   Preop cardiovascular exam 07/10/2021   Mixed hyperlipidemia 07/10/2021   New onset type 2 diabetes mellitus (HCC) 05/20/2018   Anxiety state 05/13/2018   Diastasis recti 05/13/2018   Benign essential hypertension 11/16/2013   OBESITY 07/25/2009   Angiodysplasia of stomach and duodenum 12/15/2008   ASBESTOS EXPOSURE 08/11/2008   ESOPHAGEAL STRICTURE 03/01/2008   GERD 03/01/2008   Diaphragmatic hernia 03/01/2008   Past Medical History:  Diagnosis Date   Anxiety    History of chickenpox    Hypertension  Past Surgical History:  Procedure Laterality Date   FINGER SURGERY     No Known Allergies Prior to Admission medications   Medication Sig Start Date End Date Taking? Authorizing Provider  lisinopril  (ZESTRIL ) 10 MG tablet Take 1 tablet by mouth once daily 02/04/24  Yes Levora Reyes SAUNDERS, MD  atorvastatin  (LIPITOR) 10 MG tablet Take 1 tablet (10 mg total) by mouth daily. Patient not taking: Reported on 02/06/2024 11/06/21   Levora Reyes SAUNDERS, MD  Cholecalciferol (VITAMIN D3)  250 MCG (10000 UT) TABS Take 1 tablet by mouth daily. Patient not taking: Reported on 02/06/2024    [provider]  clotrimazole  (LOTRIMIN ) 1 % cream Apply 1 application topically 2 (two) times daily. As needed for genital rash. Patient not taking: Reported on 02/06/2024 05/25/21   Levora Reyes SAUNDERS, MD  clotrimazole -betamethasone  (LOTRISONE ) cream Apply 1 Application topically 2 (two) times daily. Patient not taking: Reported on 02/06/2024 07/04/22   Sherrilee Belvie CROME, MD  docusate sodium (COLACE) 100 MG capsule Take 100 mg by mouth 2 (two) times daily. Patient not taking: Reported on 02/06/2024    [provider]  gabapentin  (NEURONTIN ) 300 MG capsule TAKE 1 CAPSULE BY MOUTH AT BEDTIME AS NEEDED Patient not taking: Reported on 02/06/2024 11/06/21   Levora Reyes SAUNDERS, MD  nitrofurantoin , macrocrystal-monohydrate, (MACROBID ) 100 MG capsule Take 1 capsule (100 mg total) by mouth every 12 (twelve) hours. Patient not taking: Reported on 02/06/2024 07/26/22   Watt Rush, MD  Testosterone  (ANDROGEL ) 20.25 MG/1.25GM (1.62%) GEL Apply 2 packets topically daily. Patient not taking: Reported on 02/06/2024 07/04/22   Sherrilee Belvie CROME, MD   Social History   Socioeconomic History   Marital status: Married    Spouse name: Not on file   Number of children: Not on file   Years of education: Not on file   Highest education level: Not on file  Occupational History   Not on file  Tobacco Use   Smoking status: Never   Smokeless tobacco: Never  Vaping Use   Vaping status: Never Used  Substance and Sexual Activity   Alcohol use: Not Currently   Drug use: Not Currently   Sexual activity: Yes  Other Topics Concern   Not on file  Social History Narrative   Not on file   Social Drivers of Health   Financial Resource Strain: Low Risk  (11/05/2023)   Overall Financial Resource Strain (CARDIA)    Difficulty of Paying Living Expenses: Not hard at all  Food Insecurity: No Food  Insecurity (11/05/2023)   Hunger Vital Sign    Worried About Running Out of Food in the Last Year: Never true    Ran Out of Food in the Last Year: Never true  Transportation Needs: No Transportation Needs (11/05/2023)   PRAPARE - Administrator, Civil Service (Medical): No    Lack of Transportation (Non-Medical): No  Physical Activity: Unknown (11/05/2023)   Exercise Vital Sign    Days of Exercise per Week: 2 days    Minutes of Exercise per Session: Not on file  Stress: No Stress Concern Present (11/05/2023)   [employer] of Occupational Health - Occupational Stress Questionnaire    Feeling of Stress: Not at all  Social Connections: Moderately Isolated (11/05/2023)   Social Connection and Isolation Panel    Frequency of Communication with Friends and Family: More than three times a week    Frequency of Social Gatherings with Friends and Family: More than three times a week  Attends Religious Services: Never    Active Member of Clubs or Organizations: No    Attends Banker Meetings: Never    Marital Status: Married  Catering manager Violence: Not At Risk (11/05/2023)   Humiliation, Afraid, Rape, and Kick questionnaire    Fear of Current or Ex-Partner: No    Emotionally Abused: No    Physically Abused: No    Sexually Abused: No    Review of Systems  Constitutional:  Negative for fatigue and unexpected weight change.  Eyes:  Negative for visual disturbance.  Respiratory:  Negative for cough, chest tightness and shortness of breath.   Cardiovascular:  Negative for chest pain, palpitations and leg swelling.  Gastrointestinal:  Negative for abdominal pain and blood in stool.  Neurological:  Negative for dizziness, light-headedness and headaches.     Objective:   Vitals:   02/06/24 1052 02/06/24 1154  BP: (!) 170/84 (!) 156/96  Pulse: 89   Temp: 97.9 F (36.6 C)   SpO2: 97%   Weight: 216 lb 6.4 oz (98.2 kg)      Physical Exam Vitals reviewed.   Constitutional:      Appearance: He is well-developed.  HENT:     Head: Normocephalic and atraumatic.  Neck:     Vascular: No carotid bruit or JVD.  Cardiovascular:     Rate and Rhythm: Normal rate and regular rhythm.     Heart sounds: Normal heart sounds. No murmur heard. Pulmonary:     Effort: Pulmonary effort is normal.     Breath sounds: Normal breath sounds. No rales.  Musculoskeletal:     Right lower leg: No edema.     Left lower leg: No edema.  Skin:    General: Skin is warm and dry.  Neurological:     Mental Status: He is alert and oriented to person, place, and time.  Psychiatric:        Mood and Affect: Mood normal.        Assessment & Plan:  CALAHAN PAK is a 67 y.o. male . Type 2 diabetes mellitus with hyperglycemia, without long-term current use of insulin (HCC) - Plan: Hemoglobin A1c, Comprehensive metabolic panel with GFR, Microalbumin / creatinine urine ratio  Benign essential hypertension - Plan: Comprehensive metabolic panel with GFR, lisinopril  (ZESTRIL ) 20 MG tablet  Hyperlipidemia associated with type 2 diabetes mellitus (HCC) - Plan: Lipid panel  Delay in follow-up discussed as above, denies any specific barriers to care.  Importance of ongoing follow-up for chronic medical conditions discussed as well as monitoring of prescription medications.  All questions were answered.  We again discussed concerns of untreated diabetes and significant hyperlipidemia, risks of cerebrovascular disease, cardiovascular disease, chronic kidney disease, peripheral vascular disease among other potential complications without treating these conditions and he understands risks and would like to still avoid medications for either cholesterol or diabetes at this time.  Updated labs were ordered as above and can review those results with patient.  Advised to let me know if he changes his mind on treatment of either condition.  Blood pressure unfortunately was running too high  today even with repeat testing.  He has tolerated lisinopril  well, agrees to medication changes.  Will increase that to 20 mg daily, home monitoring with RTC precautions, otherwise follow-up in 3 months.   Meds ordered this encounter  Medications   lisinopril  (ZESTRIL ) 20 MG tablet    Sig: Take 1 tablet (20 mg total) by mouth daily.    Dispense:  90 tablet    Refill:  1   Patient Instructions  I will check your labs again today, but as we discussed I am concerned about the untreated high cholesterol and diabetes and risks of stroke, heart disease, kidney disease, vascular disease and other complications. Please let me know if you change your mind about treatment for these conditions.  Make sure your eye doctor sends me a report for a diabetes retinopathy screening.  Blood pressure unfortunately is running too high.  Increase lisinopril  to 20 mg/day.  I sent a new prescription to your pharmacy but you can take 2 of the 10 mg that you have at home until you fill that prescription.  Monitor your home blood pressures and if you have any persistent readings above 130/80 at the higher dose, follow-up with me to discuss other changes.  Otherwise I will see you in 3 months.  Take care.     Type 2 Diabetes Mellitus, Self-Care, Adult Caring for yourself after you have been diagnosed with type 2 diabetes (type 2 diabetes mellitus) means keeping your blood sugar (glucose) under control with a balance of: Nutrition. Exercise. Lifestyle changes. Medicines or insulin, if needed. Support from your team of health care providers and others. What are the risks? Having type 2 diabetes can put you at risk for other long-term (chronic) conditions, such as heart disease and kidney disease. Your health care provider may prescribe medicines to help prevent complications from diabetes. How to monitor your blood glucose  Check your blood glucose every day or as often as told by your health care provider. Have  your A1C (hemoglobin A1C) level checked two or more times a year, or as often as told by your health care provider. Your health care provider will set personalized treatment goals for you. Generally, the goal of treatment is to maintain the following blood glucose levels: Before meals: 80-130 mg/dL (4.4-7.2 mmol/L). After meals: below 180 mg/dL (10 mmol/L). A1C level: less than 7%. How to manage hyperglycemia and hypoglycemia Hyperglycemia symptoms Hyperglycemia, also called high blood glucose, occurs when blood glucose is too high. Make sure you know the early signs of hyperglycemia, such as: Increased thirst. Hunger. Feeling very tired. Needing to urinate more often than usual. Blurry vision. Hypoglycemia symptoms Hypoglycemia, also called low blood glucose, occurs with a blood glucose level at or below 70 mg/dL (3.9 mmol/L). Diabetes medicines lower your blood glucose and can cause hypoglycemia. The risk for hypoglycemia increases during or after exercise, during sleep, during illness, and when skipping meals or not eating for a long time (fasting). It is important to know the symptoms of hypoglycemia and treat it right away. Always have a 15-gram rapid-acting carbohydrate snack with you to treat low blood glucose. Family members and close friends should also know the symptoms and understand how to treat hypoglycemia, in case you are not able to treat yourself. Symptoms may include: Hunger. Anxiety. Sweating and feeling clammy. Dizziness or feeling light-headed. Sleepiness. Increased heart rate. Irritability. Tingling or numbness around the mouth, lips, or tongue. Restless sleep. Severe hypoglycemia is when your blood glucose level is at or below 54 mg/dL (3 mmol/L). Severe hypoglycemia is an emergency. Do not wait to see if the symptoms will go away. Get medical help right away. Call your local emergency services (911 in the [place] Do not drive yourself to the hospital. If you have  severe hypoglycemia and you cannot eat or drink, you may need glucagon. A family member or close friend  should learn how to check your blood glucose and how to give you glucagon. Ask your health care provider if you need to have an emergency glucagon kit available. Follow these instructions at home: Medicines Take prescribed insulin or diabetes medicines as told by your health care provider. Do not run out of insulin or other diabetes medicines. Plan ahead so you always have these available. If you use insulin, adjust your dosage based on your physical activity and what foods you eat. Your health care provider will tell you how to adjust your dosage. Take over-the-counter and prescription medicines only as told by your health care provider. Eating and drinking  What you eat and drink affects your blood glucose and your insulin dosage. Making good choices helps to control your diabetes and prevent other health problems. A healthy meal plan includes eating lean proteins, complex carbohydrates, fresh fruits and vegetables, low-fat dairy products, and healthy fats. Make an appointment to see a registered dietitian to help you create an eating plan that is right for you. Make sure that you: Follow instructions from your health care provider about eating or drinking restrictions. Drink enough fluid to keep your urine pale yellow. Keep a record of the carbohydrates that you eat. Do this by reading food labels and learning the standard serving sizes of foods. Follow your sick-day plan whenever you cannot eat or drink as usual. Make this plan in advance with your health care provider.  Activity Stay active. Exercise regularly, as told by your health care provider. This may include: Stretching and doing strength exercises, such as yoga or weight lifting, two or more times a week. Doing 150 minutes or more of moderate-intensity or vigorous-intensity exercise each week. This could be brisk walking, biking, or  water aerobics. Spread out your activity over 3 or more days of the week. Do not go more than 2 days in a row without doing some kind of physical activity. When you start a new exercise or activity, work with your health care provider to adjust your insulin, medicines, or food intake as needed. Lifestyle Do not use any products that contain nicotine or tobacco. These products include cigarettes, chewing tobacco, and vaping devices, such as e-cigarettes. If you need help quitting, ask your health care provider. If you drink alcohol and your health care provider says that it is safe for you: Limit how much you have to: 0-1 drink a day for women who are not pregnant. 0-2 drinks a day for men. Know how much alcohol is in your drink. In the [place] one drink equals one 12 oz bottle of beer (355 mL), one 5 oz glass of wine (148 mL), or one 1 oz glass of hard liquor (44 mL). Learn to manage stress. If you need help with this, ask your health care provider. Take care of your body  Keep your immunizations up to date. In addition to getting vaccinations as told by your health care provider, it is recommended that you get vaccinated against the following illnesses: The flu (influenza). Get a flu shot every year. Pneumonia. Hepatitis B. Schedule an eye exam soon after your diagnosis, and then one time every year after that. Check your skin and feet every day for cuts, bruises, redness, blisters, or sores. Schedule a foot exam with your health care provider once every year. Brush your teeth and gums two times a day, and floss one or more times a day. Visit your dentist one or more times every 6 months. Maintain a  healthy weight. General instructions Share your diabetes management plan with people in your workplace, school, and household. Carry a medical alert card or wear medical alert jewelry. Keep all follow-up visits. This is important. Questions to ask your health care provider Should I meet with a  certified diabetes care and education specialist? Where can I find a support group for people with diabetes? Where to find more information For help and guidance and for more information about diabetes, please visit: American Diabetes Association (ADA): www.diabetes.org American Association of Diabetes Care and Education Specialists (ADCES): www.diabeteseducator.org International Diabetes Federation (IDF): DCOnly.dk Summary Caring for yourself after you have been diagnosed with type 2 diabetes (type 2 diabetes mellitus) means keeping your blood sugar (glucose) under control with a balance of nutrition, exercise, lifestyle changes, and medicine. Check your blood glucose every day, as often as told by your health care provider. Having diabetes can put you at risk for other long-term (chronic) conditions, such as heart disease and kidney disease. Your health care provider may prescribe medicines to help prevent complications from diabetes. Share your diabetes management plan with people in your workplace, school, and household. Keep all follow-up visits. This is important. This information is not intended to replace advice given to you by your health care provider. Make sure you discuss any questions you have with your health care provider. Document Revised: 08/31/2020 Document Reviewed: 08/31/2020 Elsevier Patient Education  2024 Elsevier Inc.      Signed,   Reyes Pines, MD [facility], [facility] 02/06/24 11:57 AM

## 2024-02-07 LAB — COMPREHENSIVE METABOLIC PANEL WITH GFR
ALT: 32 U/L (ref 0–53)
AST: 30 U/L (ref 0–37)
Albumin: 4.5 g/dL (ref 3.5–5.2)
Alkaline Phosphatase: 80 U/L (ref 39–117)
BUN: 11 mg/dL (ref 6–23)
CO2: 26 meq/L (ref 19–32)
Calcium: 9.7 mg/dL (ref 8.4–10.5)
Chloride: 102 meq/L (ref 96–112)
Creatinine, Ser: 0.85 mg/dL (ref 0.40–1.50)
GFR: 88.47 mL/min (ref >60.00)
Glucose, Bld: 174 mg/dL — ABNORMAL HIGH (ref 70–99)
Potassium: 4.6 meq/L (ref 3.5–5.1)
Sodium: 139 meq/L (ref 135–145)
Total Bilirubin: 0.6 mg/dL (ref 0.2–1.2)
Total Protein: 7.2 g/dL (ref 6.0–8.3)

## 2024-02-07 LAB — MICROALBUMIN / CREATININE URINE RATIO
Creatinine,U: 156 mg/dL
Microalb Creat Ratio: 12.7 mg/g (ref 0.0–30.0)
Microalb, Ur: 2 mg/dL — ABNORMAL HIGH (ref 0.0–1.9)

## 2024-02-07 LAB — LIPID PANEL
Cholesterol: 304 mg/dL — ABNORMAL HIGH (ref 0–200)
HDL: 40.3 mg/dL (ref >39.00)
LDL Cholesterol: 212 mg/dL — ABNORMAL HIGH (ref 0–99)
NonHDL: 263.78
Total CHOL/HDL Ratio: 8
Triglycerides: 259 mg/dL — ABNORMAL HIGH (ref 0.0–149.0)
VLDL: 51.8 mg/dL — ABNORMAL HIGH (ref 0.0–40.0)

## 2024-02-07 LAB — HEMOGLOBIN A1C: Hgb A1c MFr Bld: 9.8 % — ABNORMAL HIGH (ref 4.6–6.5)

## 2024-02-11 ENCOUNTER — Ambulatory Visit: Payer: Self-pay | Admitting: Family Medicine

## 2024-02-11 NOTE — Progress Notes (Signed)
 Called patient and relayed lab results. Patient said that he will call me back to schedule that appt.

## 2024-02-12 NOTE — Progress Notes (Signed)
 Patient called back and scheduled appt 05/08/24.

## 2024-03-26 NOTE — Progress Notes (Signed)
 COURVOISIER HAMBLEN                                          MRN: [id number]   03/26/2024   The [facility] Quality Team Specialist reviewed this patient medical record for the purposes of chart review for care gap closure. The following were reviewed: chart review for care gap closure-glycemic status assessment.    [facility] Quality Team

## 2024-03-26 NOTE — Progress Notes (Signed)
 David Dalton                                          MRN: [id number]   03/26/2024   The [facility] Quality Team Specialist reviewed this patient medical record for the purposes of chart review for care gap closure. The following were reviewed: chart review for care gap closure-controlling blood pressure.    [facility] Quality Team

## 2024-03-26 NOTE — Progress Notes (Signed)
 David Dalton                                          MRN: [id number]   03/26/2024   The [facility] Quality Team Specialist reviewed this patient medical record for the purposes of chart review for care gap closure. The following were reviewed: abstraction for care gap closure-kidney health evaluation for diabetes:eGFR  and uACR.    [facility] Quality Team

## 2024-04-07 NOTE — Progress Notes (Signed)
 David Dalton                                          MRN: [id number]   04/07/2024   The [facility] Quality Team Specialist reviewed this patient medical record for the purposes of chart review for care gap closure. The following were reviewed: chart review for care gap closure-controlling blood pressure.    [facility] Quality Team

## 2024-04-24 NOTE — Progress Notes (Signed)
 MERIT MAYBEE                                          MRN: [id number]   04/24/2024   The [facility] Quality Team Specialist reviewed this patient medical record for the purposes of chart review for care gap closure. The following were reviewed: chart review for care gap closure-controlling blood pressure and glycemic status assessment.    [facility] Quality Team

## 2024-04-29 ENCOUNTER — Other Ambulatory Visit: Payer: Self-pay | Admitting: Family Medicine

## 2024-04-29 DIAGNOSIS — I10 Essential (primary) hypertension: Secondary | ICD-10-CM

## 2024-05-08 ENCOUNTER — Ambulatory Visit: Admitting: Family Medicine

## 2024-11-17 ENCOUNTER — Encounter
